# Patient Record
Sex: Female | Born: 1985 | Race: Black or African American | Hispanic: No | Marital: Married | Smoking: Former smoker
Health system: Southern US, Community
[De-identification: ages and names within clinical notes are randomized; demographics above are authoritative.]

## PROBLEM LIST (undated history)

## (undated) ENCOUNTER — Inpatient Hospital Stay (HOSPITAL_COMMUNITY): Payer: Self-pay

## (undated) ENCOUNTER — Inpatient Hospital Stay (HOSPITAL_COMMUNITY): Payer: BC Managed Care – PPO

## (undated) DIAGNOSIS — F329 Major depressive disorder, single episode, unspecified: Secondary | ICD-10-CM

## (undated) DIAGNOSIS — R0789 Other chest pain: Secondary | ICD-10-CM

## (undated) DIAGNOSIS — O139 Gestational [pregnancy-induced] hypertension without significant proteinuria, unspecified trimester: Secondary | ICD-10-CM

## (undated) DIAGNOSIS — R51 Headache: Secondary | ICD-10-CM

## (undated) DIAGNOSIS — F419 Anxiety disorder, unspecified: Secondary | ICD-10-CM

## (undated) DIAGNOSIS — R0602 Shortness of breath: Secondary | ICD-10-CM

## (undated) DIAGNOSIS — F32A Depression, unspecified: Secondary | ICD-10-CM

---

## 2008-09-21 ENCOUNTER — Emergency Department (HOSPITAL_COMMUNITY): Admission: EM | Admit: 2008-09-21 | Discharge: 2008-09-21 | Payer: Self-pay | Admitting: Emergency Medicine

## 2010-05-01 LAB — CBC
MCHC: 33.3 g/dL (ref 30.0–36.0)
MCV: 89.4 fL (ref 78.0–100.0)
Platelets: 218 10*3/uL (ref 150–400)
RDW: 13.6 % (ref 11.5–15.5)

## 2010-05-01 LAB — GC/CHLAMYDIA PROBE AMP, GENITAL
Chlamydia, DNA Probe: NEGATIVE
GC Probe Amp, Genital: NEGATIVE

## 2010-05-01 LAB — POCT I-STAT, CHEM 8
Calcium, Ion: 1.15 mmol/L (ref 1.12–1.32)
Chloride: 106 mEq/L (ref 96–112)
Glucose, Bld: 92 mg/dL (ref 70–99)
HCT: 39 % (ref 36.0–46.0)
Hemoglobin: 13.3 g/dL (ref 12.0–15.0)
TCO2: 23 mmol/L (ref 0–100)

## 2010-05-01 LAB — DIFFERENTIAL
Basophils Absolute: 0.3 10*3/uL — ABNORMAL HIGH (ref 0.0–0.1)
Basophils Relative: 4 % — ABNORMAL HIGH (ref 0–1)
Eosinophils Absolute: 0.2 10*3/uL (ref 0.0–0.7)
Neutrophils Relative %: 37 % — ABNORMAL LOW (ref 43–77)

## 2010-05-01 LAB — URINALYSIS, ROUTINE W REFLEX MICROSCOPIC
Bilirubin Urine: NEGATIVE
Glucose, UA: NEGATIVE mg/dL
Hgb urine dipstick: NEGATIVE
Ketones, ur: NEGATIVE mg/dL
Nitrite: NEGATIVE
Protein, ur: NEGATIVE mg/dL
Specific Gravity, Urine: 1.03 (ref 1.005–1.030)
Urobilinogen, UA: 1 mg/dL (ref 0.0–1.0)
pH: 5.5 (ref 5.0–8.0)

## 2010-05-01 LAB — WET PREP, GENITAL: Trich, Wet Prep: NONE SEEN

## 2010-05-01 LAB — SYPHILIS: RPR W/REFLEX TO RPR TITER AND TREPONEMAL ANTIBODIES, TRADITIONAL SCREENING AND DIAGNOSIS ALGORITHM: RPR Ser Ql: NONREACTIVE

## 2010-05-03 ENCOUNTER — Other Ambulatory Visit: Payer: Self-pay | Admitting: Family Medicine

## 2010-05-03 DIAGNOSIS — R102 Pelvic and perineal pain: Secondary | ICD-10-CM

## 2010-05-04 ENCOUNTER — Ambulatory Visit
Admission: RE | Admit: 2010-05-04 | Discharge: 2010-05-04 | Disposition: A | Discharge status: Home / Self Care | Payer: BLUE CROSS/BLUE SHIELD | Source: Ambulatory Visit | Attending: Family Medicine | Admitting: Family Medicine

## 2010-05-04 DIAGNOSIS — R102 Pelvic and perineal pain: Secondary | ICD-10-CM

## 2010-10-11 ENCOUNTER — Inpatient Hospital Stay (HOSPITAL_COMMUNITY)
Admission: AD | Admit: 2010-10-11 | Discharge: 2010-10-11 | Disposition: A | Discharge status: Home / Self Care | Payer: BC Managed Care – PPO | Source: Ambulatory Visit | Attending: Obstetrics and Gynecology | Admitting: Obstetrics and Gynecology

## 2010-10-11 ENCOUNTER — Encounter (HOSPITAL_COMMUNITY): Payer: Self-pay

## 2010-10-11 DIAGNOSIS — R109 Unspecified abdominal pain: Secondary | ICD-10-CM | POA: Insufficient documentation

## 2010-10-11 DIAGNOSIS — N803 Endometriosis of pelvic peritoneum, unspecified: Secondary | ICD-10-CM | POA: Insufficient documentation

## 2010-10-11 DIAGNOSIS — N809 Endometriosis, unspecified: Secondary | ICD-10-CM

## 2010-10-11 DIAGNOSIS — N949 Unspecified condition associated with female genital organs and menstrual cycle: Secondary | ICD-10-CM | POA: Insufficient documentation

## 2010-10-11 LAB — URINE MICROSCOPIC-ADD ON

## 2010-10-11 LAB — URINALYSIS, ROUTINE W REFLEX MICROSCOPIC
Bilirubin Urine: NEGATIVE
Ketones, ur: NEGATIVE mg/dL
Nitrite: NEGATIVE
pH: 6.5 (ref 5.0–8.0)

## 2010-10-11 MED ORDER — TRAMADOL HCL 50 MG PO TABS: 50.0000 mg | ORAL_TABLET | Freq: Four times a day (QID) | ORAL | Status: AC | PRN

## 2010-10-11 NOTE — ED Provider Notes (Signed)
Pt is a private pt of Dr. Kittie Plater.  She has known endometriosis and is scheduled for a laparoscopy 11/03/10.  Pt has had complete pelvic pain workup, but states she cannot wait until 10/10 as her pain is incapacitating.  The pain today is exactly the same type of pain that she has been experiencing.  Physical Exam:  HRR&R, LCTAB, abdomen soft, no guarding or rebound tenderness.  States pain is bilaterally in the ovary region.    Dr. Claiborne Billings called, who spoke with Dr. Henderson Cloud.  Dr. Henderson Cloud requests a prescription for Ultram 50mg  #30 with 1 refill be given, with f/u in her office next week.

## 2010-10-11 NOTE — Progress Notes (Signed)
Pt states she has a history of endometrosis. Was seen last week in the office and is scheduled for a laparoscopy on 10-10. Pt states she is unable to tolerate the pain.

## 2010-10-28 ENCOUNTER — Encounter (HOSPITAL_COMMUNITY)
Admission: RE | Admit: 2010-10-28 | Discharge: 2010-10-28 | Disposition: A | Discharge status: Home / Self Care | Payer: BC Managed Care – PPO | Source: Ambulatory Visit | Attending: Obstetrics and Gynecology | Admitting: Obstetrics and Gynecology

## 2010-10-28 ENCOUNTER — Encounter (HOSPITAL_COMMUNITY): Payer: Self-pay

## 2010-10-28 ENCOUNTER — Other Ambulatory Visit: Payer: Self-pay

## 2010-10-28 HISTORY — DX: Other chest pain: R07.89

## 2010-10-28 HISTORY — DX: Major depressive disorder, single episode, unspecified: F32.9

## 2010-10-28 HISTORY — DX: Headache: R51

## 2010-10-28 HISTORY — DX: Shortness of breath: R06.02

## 2010-10-28 HISTORY — DX: Depression, unspecified: F32.A

## 2010-10-28 HISTORY — DX: Anxiety disorder, unspecified: F41.9

## 2010-10-28 LAB — CBC
HCT: 43.3 % (ref 36.0–46.0)
Platelets: 267 10*3/uL (ref 150–400)
RDW: 14.2 % (ref 11.5–15.5)
WBC: 7 10*3/uL (ref 4.0–10.5)

## 2010-10-28 LAB — SURGICAL PCR SCREEN: Staphylococcus aureus: NEGATIVE

## 2010-10-28 NOTE — Anesthesia Preprocedure Evaluation (Signed)
Anesthesia Evaluation  Name, MR# and DOB Patient awake  General Assessment Comment  Reviewed: Allergy & Precautions, H&P , Patient's Chart, lab work & pertinent test results  Airway Mallampati: II TM Distance: >3 FB Neck ROM: full    Dental No notable dental hx. (+) Teeth Intact   Pulmonary  Has been having chest pain and SOB for last 3 weeks worse since started Ultram. clear to auscultation  Pulmonary exam normal       Cardiovascular regular Normal 12 lead EKG reviewed SB    Neuro/Psych Negative Neurological ROS  Negative Psych ROS   GI/Hepatic negative GI ROS Neg liver ROS    Endo/Other  Negative Endocrine ROS  Renal/GU negative Renal ROS  Genitourinary negative   Musculoskeletal   Abdominal   Peds  Hematology negative hematology ROS (+)   Anesthesia Other Findings   Reproductive/Obstetrics                           Anesthesia Physical Anesthesia Plan  ASA: I  Anesthesia Plan: General   Post-op Pain Management:    Induction:   Airway Management Planned:   Additional Equipment:   Intra-op Plan:   Post-operative Plan:   Informed Consent: I have reviewed the patients History and Physical, chart, labs and discussed the procedure including the risks, benefits and alternatives for the proposed anesthesia with the patient or authorized representative who has indicated his/her understanding and acceptance.   Dental Advisory Given  Plan Discussed with: Anesthesiologist  Anesthesia Plan Comments:         Anesthesia Quick Evaluation

## 2010-10-28 NOTE — Patient Instructions (Signed)
   Your procedure is scheduled on:11/03/10  Enter through the Main Entrance of [facility] at:10am Pick up the phone at the desk and dial [phone] and inform us of your arrival  Please call this number if you have any problems the morning of surgery: [phone]  Remember: Do not eat food after midnight  Do not drink clear liquids after:midnight Take these medicines the morning of surgery with a SIP OF WATER:pain meds if needed  Do not wear jewelry, make-up, or FINGER nail polish Do not wear lotions, powders, or perfumes.  You may wear deodorant. Do not shave 48 hours prior to surgery. Do not bring valuables to the hospital. Leave suitcase in the car. After Surgery it may be brought to your room. For patients being admitted to the hospital, checkout time is 11:00am the day of discharge.  Patients discharged on the day of surgery will not be allowed to drive home.   Name and phone number of your driver:Obrian- [phone]   Remember to use your hibiclens as instructed.Please shower with 1/2 bottle the evening before your surgery and the other 1/2 bottle the morning of surgery.

## 2010-10-29 ENCOUNTER — Other Ambulatory Visit (HOSPITAL_COMMUNITY): Payer: BLUE CROSS/BLUE SHIELD

## 2010-11-02 MED ORDER — MEPERIDINE HCL 25 MG/ML IJ SOLN: 6.2500 mg | INTRAMUSCULAR | Status: DC | PRN

## 2010-11-02 MED ORDER — SCOPOLAMINE 1 MG/3DAYS TD PT72
1.0000 | MEDICATED_PATCH | Freq: Once | TRANSDERMAL | Status: DC
  Administered 2010-11-03: 1.5 mg via TRANSDERMAL

## 2010-11-02 MED ORDER — LACTATED RINGERS IV SOLN
INTRAVENOUS | Status: DC
  Administered 2010-11-03 (×2): via INTRAVENOUS

## 2010-11-02 MED ORDER — METOCLOPRAMIDE HCL 5 MG/ML IJ SOLN: 10.0000 mg | Freq: Once | INTRAMUSCULAR | Status: AC | PRN

## 2010-11-02 MED ORDER — FENTANYL CITRATE 0.05 MG/ML IJ SOLN
25.0000 ug | INTRAMUSCULAR | Status: DC | PRN
  Administered 2010-11-03 (×3): 25 ug via INTRAVENOUS

## 2010-11-02 NOTE — H&P (Signed)
25 y.o. yo complains of severe dysmenorrhea, R and L LQpain, dyspareunia and menorrhagia.  These symptoms started two years ago and have gotten progressively worse.  Now pain is severe and causing distress daily, L>R and inhibiting patient's life.  Periods are even worse with pain and especially with the passage of clots.  The pain is sharp like knives and keeps her up at night.  Periods are very heavy and she now has bleeding in between periods. Pt was on OCPs in 2010 but had developed migraines.  She stopped pills in 2011, but migraines worsened.  She restarted OCPs recently but stopped again.  She also c/o deep dyspareunia with no relief with change in position.  She wishes to retain her fertility.  Past Medical History  Diagnosis Date  . Endometriosis   . Chest pain, atypical     per pt ? from Ultram- upper right chest pain  . Headache   . Anxiety   . Depression     no meds  . Shortness of breath    Past Surgical History  Procedure Date  . No past surgeries     History   Social History  . Marital Status: Single    Spouse Name: N/A    Number of Children: N/A  . Years of Education: N/A   Occupational History  . Not on file.   Social History Main Topics  . Smoking status: Current Some Day Smoker    Types: Cigarettes  . Smokeless tobacco: Not on file  . Alcohol Use: Yes     occasional  . Drug Use: No  . Sexually Active:    Other Topics Concern  . Not on file   Social History Narrative  . No narrative on file    No current facility-administered medications on file prior to encounter.   No current outpatient prescriptions on file prior to encounter.    No Known Allergies  @VITALS2 @  Lungs: clear to ascultation Cor:  RRR Abdomen:  soft, nontender, nondistended. Ex:  no cords, erythema Pelvic:  NEFG, Nml s/s uterus, tender L>R.  No masses.  U/S  6.7x3x3, no fibroids.  Nml R ovary 2.8; L ovary not seen.    A:  Probable endometriosis.  Pt denies PID in past.   Desires surgical resection if possible with preservation of fertility.   P:  For DX scope, possible Robo resection of endometriosis or adhesions.   All risks, benefits and alternatives d/w patient and she desires to proceed .  Patient has undergone a modified bowel prep and will receive preop antibiotics and SCDs during the operation.   I had prolonged discussion with patient about need for further pain management with alternative therapies and Lupron postoperatively.  Anishka Bushard,Atlee A

## 2010-11-03 ENCOUNTER — Encounter (HOSPITAL_COMMUNITY): Payer: Self-pay | Admitting: Anesthesiology

## 2010-11-03 ENCOUNTER — Encounter (HOSPITAL_COMMUNITY)
Admission: RE | Disposition: A | Discharge status: Home / Self Care | Payer: Self-pay | Source: Ambulatory Visit | Attending: Obstetrics and Gynecology

## 2010-11-03 ENCOUNTER — Ambulatory Visit (HOSPITAL_COMMUNITY)
Admission: RE | Admit: 2010-11-03 | Discharge: 2010-11-03 | Disposition: A | Discharge status: Home / Self Care | Payer: BC Managed Care – PPO | Source: Ambulatory Visit | Attending: Obstetrics and Gynecology | Admitting: Obstetrics and Gynecology

## 2010-11-03 ENCOUNTER — Ambulatory Visit (HOSPITAL_COMMUNITY): Payer: BC Managed Care – PPO | Admitting: Anesthesiology

## 2010-11-03 DIAGNOSIS — Z01818 Encounter for other preprocedural examination: Secondary | ICD-10-CM | POA: Insufficient documentation

## 2010-11-03 DIAGNOSIS — R1032 Left lower quadrant pain: Secondary | ICD-10-CM | POA: Insufficient documentation

## 2010-11-03 DIAGNOSIS — R1031 Right lower quadrant pain: Secondary | ICD-10-CM | POA: Insufficient documentation

## 2010-11-03 DIAGNOSIS — Z9889 Other specified postprocedural states: Secondary | ICD-10-CM

## 2010-11-03 DIAGNOSIS — IMO0002 Reserved for concepts with insufficient information to code with codable children: Secondary | ICD-10-CM | POA: Insufficient documentation

## 2010-11-03 DIAGNOSIS — Z01812 Encounter for preprocedural laboratory examination: Secondary | ICD-10-CM | POA: Insufficient documentation

## 2010-11-03 DIAGNOSIS — N920 Excessive and frequent menstruation with regular cycle: Secondary | ICD-10-CM | POA: Insufficient documentation

## 2010-11-03 DIAGNOSIS — N946 Dysmenorrhea, unspecified: Secondary | ICD-10-CM | POA: Insufficient documentation

## 2010-11-03 HISTORY — PX: LAPAROSCOPY: SHX197

## 2010-11-03 SURGERY — LAPAROSCOPY, DIAGNOSTIC
Anesthesia: General | Site: Abdomen | Wound class: Clean Contaminated

## 2010-11-03 MED ORDER — DEXAMETHASONE SODIUM PHOSPHATE 10 MG/ML IJ SOLN
INTRAMUSCULAR | Status: AC
  Filled 2010-11-03: qty 1

## 2010-11-03 MED ORDER — FENTANYL CITRATE 0.05 MG/ML IJ SOLN
INTRAMUSCULAR | Status: AC
  Administered 2010-11-03: 25 ug via INTRAVENOUS
  Filled 2010-11-03: qty 2

## 2010-11-03 MED ORDER — IBUPROFEN 200 MG PO TABS: 800.0000 mg | ORAL_TABLET | Freq: Four times a day (QID) | ORAL | Status: DC | PRN

## 2010-11-03 MED ORDER — ONDANSETRON HCL 4 MG/2ML IJ SOLN
INTRAMUSCULAR | Status: DC | PRN
  Administered 2010-11-03: 4 mg via INTRAVENOUS

## 2010-11-03 MED ORDER — OXYCODONE-ACETAMINOPHEN 5-325 MG PO TABS: 1.0000 | ORAL_TABLET | ORAL | Status: AC | PRN

## 2010-11-03 MED ORDER — PROPOFOL 10 MG/ML IV EMUL
INTRAVENOUS | Status: AC
  Filled 2010-11-03: qty 40

## 2010-11-03 MED ORDER — IBUPROFEN 200 MG PO TABS: 800.0000 mg | ORAL_TABLET | Freq: Three times a day (TID) | ORAL | Status: DC | PRN

## 2010-11-03 MED ORDER — ROCURONIUM BROMIDE 100 MG/10ML IV SOLN
INTRAVENOUS | Status: DC | PRN
  Administered 2010-11-03: 40 mg via INTRAVENOUS

## 2010-11-03 MED ORDER — SUFENTANIL CITRATE 50 MCG/ML IV SOLN
INTRAVENOUS | Status: DC | PRN
  Administered 2010-11-03: 20 ug via INTRAVENOUS

## 2010-11-03 MED ORDER — MIDAZOLAM HCL 2 MG/2ML IJ SOLN
INTRAMUSCULAR | Status: AC
  Filled 2010-11-03: qty 2

## 2010-11-03 MED ORDER — GLYCOPYRROLATE 0.2 MG/ML IJ SOLN
INTRAMUSCULAR | Status: DC | PRN
  Administered 2010-11-03: .3 mg via INTRAVENOUS

## 2010-11-03 MED ORDER — LIDOCAINE HCL (CARDIAC) 20 MG/ML IV SOLN
INTRAVENOUS | Status: DC | PRN
  Administered 2010-11-03: 60 mg via INTRAVENOUS

## 2010-11-03 MED ORDER — KETOROLAC TROMETHAMINE 30 MG/ML IJ SOLN
INTRAMUSCULAR | Status: AC
  Filled 2010-11-03: qty 1

## 2010-11-03 MED ORDER — ONDANSETRON HCL 4 MG/2ML IJ SOLN
INTRAMUSCULAR | Status: AC
  Filled 2010-11-03: qty 2

## 2010-11-03 MED ORDER — LIDOCAINE HCL (CARDIAC) 20 MG/ML IV SOLN
INTRAVENOUS | Status: AC
  Filled 2010-11-03: qty 5

## 2010-11-03 MED ORDER — SUFENTANIL CITRATE 50 MCG/ML IV SOLN
INTRAVENOUS | Status: AC
  Filled 2010-11-03: qty 1

## 2010-11-03 MED ORDER — ROCURONIUM BROMIDE 50 MG/5ML IV SOLN
INTRAVENOUS | Status: AC
  Filled 2010-11-03: qty 1

## 2010-11-03 MED ORDER — PROPOFOL 10 MG/ML IV EMUL
INTRAVENOUS | Status: DC | PRN
  Administered 2010-11-03: 180 mg via INTRAVENOUS

## 2010-11-03 MED ORDER — SCOPOLAMINE 1 MG/3DAYS TD PT72
MEDICATED_PATCH | TRANSDERMAL | Status: AC
  Administered 2010-11-03: 1.5 mg via TRANSDERMAL
  Filled 2010-11-03: qty 1

## 2010-11-03 MED ORDER — MIDAZOLAM HCL 5 MG/5ML IJ SOLN
INTRAMUSCULAR | Status: DC | PRN
  Administered 2010-11-03: 2 mg via INTRAVENOUS

## 2010-11-03 MED ORDER — NEOSTIGMINE METHYLSULFATE 1 MG/ML IJ SOLN
INTRAMUSCULAR | Status: AC
  Filled 2010-11-03: qty 10

## 2010-11-03 MED ORDER — NEOSTIGMINE METHYLSULFATE 1 MG/ML IJ SOLN
INTRAMUSCULAR | Status: DC | PRN
  Administered 2010-11-03: 5 mg via INTRAVENOUS

## 2010-11-03 MED ORDER — GLYCOPYRROLATE 0.2 MG/ML IJ SOLN
INTRAMUSCULAR | Status: AC
  Filled 2010-11-03: qty 2

## 2010-11-03 MED ORDER — DEXAMETHASONE SODIUM PHOSPHATE 10 MG/ML IJ SOLN
INTRAMUSCULAR | Status: DC | PRN
  Administered 2010-11-03: 10 mg via INTRAVENOUS

## 2010-11-03 MED ORDER — FENTANYL CITRATE 0.05 MG/ML IJ SOLN: 1.0000 ug/kg | INTRAMUSCULAR | Status: DC | PRN

## 2010-11-03 SURGICAL SUPPLY — 55 items
BARRIER ADHS 3X4 INTERCEED (GAUZE/BANDAGES/DRESSINGS) ×3 IMPLANT
BENZOIN TINCTURE PRP APPL 2/3 (GAUZE/BANDAGES/DRESSINGS) ×3 IMPLANT
BLADELESS LONG 8MM (BLADE) ×3 IMPLANT
CABLE HIGH FREQUENCY MONO STRZ (ELECTRODE) ×3 IMPLANT
CHLORAPREP W/TINT 26ML (MISCELLANEOUS) ×3 IMPLANT
CLOTH BEACON ORANGE TIMEOUT ST (SAFETY) ×3 IMPLANT
CONT PATH 16OZ SNAP LID 3702 (MISCELLANEOUS) ×3 IMPLANT
COVER MAYO STAND STRL (DRAPES) ×3 IMPLANT
COVER TABLE BACK 60X90 (DRAPES) ×6 IMPLANT
COVER TIP SHEARS 8 DVNC (MISCELLANEOUS) ×2 IMPLANT
COVER TIP SHEARS 8MM DA VINCI (MISCELLANEOUS) ×1
DECANTER SPIKE VIAL GLASS SM (MISCELLANEOUS) ×3 IMPLANT
DERMABOND ADVANCED (GAUZE/BANDAGES/DRESSINGS) ×1
DERMABOND ADVANCED .7 DNX12 (GAUZE/BANDAGES/DRESSINGS) ×2 IMPLANT
DRAPE HUG U DISPOSABLE (DRAPE) ×3 IMPLANT
DRAPE HYSTEROSCOPY (DRAPE) ×3 IMPLANT
DRAPE LG THREE QUARTER DISP (DRAPES) ×6 IMPLANT
DRAPE MONITOR DA VINCI (DRAPE) ×3 IMPLANT
DRAPE WARM FLUID 44X44 (DRAPE) ×3 IMPLANT
ELECT REM PT RETURN 9FT ADLT (ELECTROSURGICAL) ×3
ELECTRODE REM PT RTRN 9FT ADLT (ELECTROSURGICAL) ×2 IMPLANT
EVACUATOR SMOKE 8.L (FILTER) ×3 IMPLANT
GLOVE BIO SURGEON STRL SZ7 (GLOVE) ×9 IMPLANT
GLOVE ECLIPSE 6.5 STRL STRAW (GLOVE) ×9 IMPLANT
GOWN PREVENTION PLUS LG XLONG (DISPOSABLE) ×18 IMPLANT
KIT DISP ACCESSORY 4 ARM (KITS) ×3 IMPLANT
NEEDLE INSUFFLATION 14GA 120MM (NEEDLE) ×3 IMPLANT
NS IRRIG 1000ML POUR BTL (IV SOLUTION) ×9 IMPLANT
OCCLUDER COLPOPNEUMO (BALLOONS) ×6 IMPLANT
PACK LAVH (CUSTOM PROCEDURE TRAY) ×3 IMPLANT
PAD PREP 24X48 CUFFED NSTRL (MISCELLANEOUS) ×6 IMPLANT
POSITIONER SURGICAL ARM (MISCELLANEOUS) ×6 IMPLANT
SET IRRIG TUBING LAPAROSCOPIC (IRRIGATION / IRRIGATOR) ×3 IMPLANT
SOLUTION ELECTROLUBE (MISCELLANEOUS) ×3 IMPLANT
STRIP CLOSURE SKIN 1/2X4 (GAUZE/BANDAGES/DRESSINGS) ×3 IMPLANT
SUT VIC AB 0 CT1 27 (SUTURE) ×5
SUT VIC AB 0 CT1 27XBRD ANBCTR (SUTURE) ×10 IMPLANT
SUT VIC AB 2-0 CT2 27 (SUTURE) ×6 IMPLANT
SUT VICRYL 0 UR6 27IN ABS (SUTURE) ×6 IMPLANT
SUT VICRYL RAPIDE 3 0 (SUTURE) ×6 IMPLANT
SYR 50ML LL SCALE MARK (SYRINGE) ×3 IMPLANT
SYSTEM CONVERTIBLE TROCAR (TROCAR) IMPLANT
TIP UTERINE 5.1X6CM LAV DISP (MISCELLANEOUS) IMPLANT
TIP UTERINE 6.7X10CM GRN DISP (MISCELLANEOUS) IMPLANT
TIP UTERINE 6.7X6CM WHT DISP (MISCELLANEOUS) IMPLANT
TIP UTERINE 6.7X8CM BLUE DISP (MISCELLANEOUS) ×3 IMPLANT
TOWEL OR 17X24 6PK STRL BLUE (TOWEL DISPOSABLE) ×6 IMPLANT
TRAY FOLEY BAG SILVER LF 14FR (CATHETERS) ×3 IMPLANT
TROCAR DISP BLADELESS 8 DVNC (TROCAR) ×2 IMPLANT
TROCAR DISP BLADELESS 8MM (TROCAR) ×1
TROCAR XCEL 12X100 BLDLESS (ENDOMECHANICALS) ×3 IMPLANT
TROCAR Z-THREAD 12X150 (TROCAR) IMPLANT
TUBING FILTER THERMOFLATOR (ELECTROSURGICAL) ×3 IMPLANT
WARMER LAPAROSCOPE (MISCELLANEOUS) ×3 IMPLANT
WATER STERILE IRR 1000ML POUR (IV SOLUTION) ×3 IMPLANT

## 2010-11-03 NOTE — Progress Notes (Signed)
Pt up to the bathroom to void, initially unable fluids resumed and 600 cc LR given, up to void with good results.  Will discharge home

## 2010-11-03 NOTE — Transfer of Care (Signed)
Immediate Anesthesia Transfer of Care Note  Patient: Donna Alvarado  Procedure(s) Performed:  LAPAROSCOPY DIAGNOSTIC - Chromopertubation  Patient Location: PACU  Anesthesia Type: General  Level of Consciousness: alert , oriented and sedated  Airway & Oxygen Therapy: Patient Spontanous Breathing and Patient connected to nasal cannula oxygen  Post-op Assessment: Report given to PACU RN and Post -op Vital signs reviewed and stable  Post vital signs: stable  Complications: No apparent anesthesia complications

## 2010-11-03 NOTE — Op Note (Signed)
11/03/2010  12:20 PM  PATIENT:  Donna Alvarado  25 y.o. female  PRE-OPERATIVE DIAGNOSIS:  Pelvic Pain  POST-OPERATIVE DIAGNOSIS:  Pelvic Pain  PROCEDURE:  Procedure(s): LAPAROSCOPY DIAGNOSTIC  SURGEON:  Surgeon(s): Mahathi A Remiel Corti W Scott Bowie  PHYSICIAN ASSISTANT:   ASSISTANTS: Bowie   ANESTHESIA:   general  EBL:  Total I/O In: 1000 [I.V.:1000] Out: -   BLOOD ADMINISTERED:none  SPECIMEN:  No Specimen  DISPOSITION OF SPECIMEN:  N/A  COUNTS:  YES  DICTATION: See below  PLAN OF CARE: Discharge to home after PACU  PATIENT DISPOSITION:  PACU - hemodynamically stable.   Delay start of Pharmacological VTE agent (>24hrs) due to surgical blood loss or risk of bleeding:  not applicable  Complications: none  Medications:  None  Findings:  Apparently normal pelvis.  No lesions or adhesions seen on any structures, including the ovaries, peritoneum under ovaries, uterus, tubes, cul de sac, anterior bladder or anterior abdominal wall.  The liver edge, diaphragm and appendix were all normal.  The tubes were normal caliber and had normal fimbriae and there was excellent spillage from both tubes.    Technique: After adequate general anesthesia was achieved, the patient was prepped and draped in the usual fashion.  A 8 cm Rumi was placed in the cervix and secured.  The bladder was drained with a foley.  A two cm incision was made above the umbilicus and the veress needle passed inside without aspiration of bowel contents or blood.  The 12 mm trocar was placed without comp after the abdomin had been insufflated.  The camera was placed inside and a 8.5 mm trocar placed 3 above the right iliac crest under direct visualization of the camera.  A careful and systematic exploration was performed and the above findings were noted.  Only the small direct inguinal hernia on the right was seen.  No further operation was needed and the instruments were removed from the abdomen and the  abdomen desufflated.  The 12 mm trocar fascia was closed with a figure of eight stitch and the skin incisions closed with subq 3-0vicryl R.  The incisions were closed with dermabond.  The patient tolerated the procedure well and was returned to the recovery room in stable condition.    Melvin Whiteford,Mykelle A                

## 2010-11-03 NOTE — Brief Op Note (Signed)
11/03/2010  12:20 PM  PATIENT:  Donna Alvarado  25 y.o. female  PRE-OPERATIVE DIAGNOSIS:  Pelvic Pain  POST-OPERATIVE DIAGNOSIS:  Pelvic Pain  PROCEDURE:  Procedure(s): LAPAROSCOPY DIAGNOSTIC  SURGEON:  Surgeon(s): Elray Mcgregor  PHYSICIAN ASSISTANT:   ASSISTANTS: Bowie   ANESTHESIA:   general  EBL:  Total I/O In: 1000 [I.V.:1000] Out: -   BLOOD ADMINISTERED:none  SPECIMEN:  No Specimen  DISPOSITION OF SPECIMEN:  N/A  COUNTS:  YES  DICTATION: See below  PLAN OF CARE: Discharge to home after PACU  PATIENT DISPOSITION:  PACU - hemodynamically stable.   Delay start of Pharmacological VTE agent (>24hrs) due to surgical blood loss or risk of bleeding:  not applicable  Complications: none  Medications:  None  Findings:  Apparently normal pelvis.  No lesions or adhesions seen on any structures, including the ovaries, peritoneum under ovaries, uterus, tubes, cul de sac, anterior bladder or anterior abdominal wall.  The liver edge, diaphragm and appendix were all normal.  The tubes were normal caliber and had normal fimbriae and there was excellent spillage from both tubes.    Technique: After adequate general anesthesia was achieved, the patient was prepped and draped in the usual fashion.  A 8 cm Rumi was placed in the cervix and secured.  The bladder was drained with a foley.  A two cm incision was made above the umbilicus and the veress needle passed inside without aspiration of bowel contents or blood.  The 12 mm trocar was placed without comp after the abdomin had been insufflated.  The camera was placed inside and a 8.5 mm trocar placed 3 above the right iliac crest under direct visualization of the camera.  A careful and systematic exploration was performed and the above findings were noted.  Only the small direct inguinal hernia on the right was seen.  No further operation was needed and the instruments were removed from the abdomen and the  abdomen desufflated.  The 12 mm trocar fascia was closed with a figure of eight stitch and the skin incisions closed with subq 3-0vicryl R.  The incisions were closed with dermabond.  The patient tolerated the procedure well and was returned to the recovery room in stable condition.    Ladean Steinmeyer,Lindsea A

## 2010-11-03 NOTE — Anesthesia Postprocedure Evaluation (Signed)
Anesthesia Post Note  Patient: Donna Alvarado  Procedure(s) Performed:  LAPAROSCOPY DIAGNOSTIC - Chromopertubation  Anesthesia type: General  Patient location: PACU  Post pain: Pain level controlled  Post assessment: Post-op Vital signs reviewed  Last Vitals:  Filed Vitals:   11/03/10 1330  BP: 122/88  Pulse: 63  Temp:   Resp: 21    Post vital signs: Reviewed  Level of consciousness: sedated  Complications: No apparent anesthesia complications

## 2010-11-03 NOTE — Progress Notes (Signed)
There has been no change in the patients history, status or exam since the history and physical.  Filed Vitals:   11/03/10 1018  BP: 126/80  Pulse: 74  Temp: 98.1 F (36.7 C)  TempSrc: Oral  Resp: 18  SpO2: 100%    Lab Results  Component Value Date   WBC 7.0 10/28/2010   HGB 14.2 10/28/2010   HCT 43.3 10/28/2010   MCV 89.6 10/28/2010   PLT 267 10/28/2010    Marlene Pfluger,Armentha A

## 2010-11-04 NOTE — Anesthesia Postprocedure Evaluation (Signed)
  Anesthesia Post-op Note  Patient: Donna Alvarado  Procedure(s) Performed:  LAPAROSCOPY DIAGNOSTIC - Chromopertubation   Patient is awake, responsive, moving her legs, and has signs of resolution of her numbness. Pain and nausea are reasonably well controlled. Vital signs are stable and clinically acceptable. Oxygen saturation is clinically acceptable. There are no apparent anesthetic complications at this time. Patient is ready for discharge.

## 2010-11-07 ENCOUNTER — Encounter (HOSPITAL_COMMUNITY): Payer: Self-pay | Admitting: Obstetrics and Gynecology

## 2011-03-11 ENCOUNTER — Encounter: Payer: Self-pay | Admitting: Internal Medicine

## 2011-03-11 ENCOUNTER — Ambulatory Visit (INDEPENDENT_AMBULATORY_CARE_PROVIDER_SITE_OTHER): Payer: BLUE CROSS/BLUE SHIELD | Admitting: Internal Medicine

## 2011-03-11 VITALS — BP 111/79 | HR 87 | Temp 99.5°F | Resp 16 | Ht 62.5 in | Wt 162.2 lb

## 2011-03-11 DIAGNOSIS — R059 Cough, unspecified: Secondary | ICD-10-CM

## 2011-03-11 DIAGNOSIS — R05 Cough: Secondary | ICD-10-CM

## 2011-03-11 DIAGNOSIS — R1110 Vomiting, unspecified: Secondary | ICD-10-CM

## 2011-03-11 DIAGNOSIS — J029 Acute pharyngitis, unspecified: Secondary | ICD-10-CM

## 2011-03-11 DIAGNOSIS — B9689 Other specified bacterial agents as the cause of diseases classified elsewhere: Secondary | ICD-10-CM | POA: Insufficient documentation

## 2011-03-11 DIAGNOSIS — N760 Acute vaginitis: Secondary | ICD-10-CM

## 2011-03-11 LAB — POCT URINALYSIS DIPSTICK
Glucose, UA: NEGATIVE
Spec Grav, UA: 1.02
Urobilinogen, UA: NEGATIVE

## 2011-03-11 LAB — POCT CBC
Hemoglobin: 13.6 g/dL (ref 12.2–16.2)
Lymph, poc: 2.5 (ref 0.6–3.4)
MCH, POC: 27.6 pg (ref 27–31.2)
MCHC: 30.9 g/dL — AB (ref 31.8–35.4)
MID (cbc): 1 — AB (ref 0–0.9)
MPV: 12.2 fL (ref 0–99.8)
POC MID %: 13.5 %M — AB (ref 0–12)
Platelet Count, POC: 184 10*3/uL (ref 142–424)
WBC: 7.1 10*3/uL (ref 4.6–10.2)

## 2011-03-11 MED ORDER — MOMETASONE FUROATE 50 MCG/ACT NA SUSP: 2.0000 | Freq: Every day | NASAL | Status: DC

## 2011-03-11 MED ORDER — ONDANSETRON 4 MG PO TBDP
8.0000 mg | ORAL_TABLET | Freq: Once | ORAL | Status: AC
  Administered 2011-03-11: 8 mg via ORAL

## 2011-03-11 MED ORDER — AZITHROMYCIN 250 MG PO TABS: ORAL_TABLET | ORAL | Status: AC

## 2011-03-11 MED ORDER — ONDANSETRON HCL 4 MG PO TABS: 4.0000 mg | ORAL_TABLET | Freq: Three times a day (TID) | ORAL | Status: DC | PRN

## 2011-03-11 MED ORDER — ONDANSETRON HCL 4 MG PO TABS: 4.0000 mg | ORAL_TABLET | Freq: Three times a day (TID) | ORAL | Status: AC | PRN

## 2011-03-11 NOTE — Patient Instructions (Addendum)
Hold your Metronidazole for 3-4 days until you are feeling better than restart.  The vomiting may be due to the antibiotic especially when taken on an empty or almost empty stomach.  When you restart be sure you take it after a meal.  Take the Zofran every 8 hours as needed for nausea, stick with clear fluids today and advance as tolerated.  You may try Imodium for the diarrhea, take this as directed.  RTC if not better in 1-2 days  Viral Gastroenteritis Viral gastroenteritis is also known as stomach flu. This condition affects the stomach and intestinal tract. The illness typically lasts 3 to 8 days. Most people develop an immune response. This eventually gets rid of the virus. While this natural response develops, the virus can make you quite ill.  CAUSES  Diarrhea and vomiting are often caused by a virus. Medicines (antibiotics) that kill germs will not help unless there is also a germ (bacterial) infection. SYMPTOMS  The most common symptom is diarrhea. This can cause severe loss of fluids (dehydration) and body salt (electrolyte) imbalance. TREATMENT  Treatments for this illness are aimed at rehydration. Antidiarrheal medicines are not recommended. They do not decrease diarrhea volume and may be harmful. Usually, home treatment is all that is needed. The most serious cases involve vomiting so severely that you are not able to keep down fluids taken by mouth (orally). In these cases, intravenous (IV) fluids are needed. Vomiting with viral gastroenteritis is common, but it will usually go away with treatment. HOME CARE INSTRUCTIONS  Small amounts of fluids should be taken frequently. Large amounts at one time may not be tolerated. Plain water may be harmful in infants and the elderly. Oral rehydration solutions (ORS) are available at pharmacies and grocery stores. ORS replace water and important electrolytes in proper proportions. Sports drinks are not as effective as ORS and may be harmful due to  sugars worsening diarrhea.  As a general guideline for children, replace any new fluid losses from diarrhea or vomiting with ORS as follows:   If your child weighs 22 pounds or under (10 kg or less), give 60-120 mL (1/4 - 1/2 cup or 2 - 4 ounces) of ORS for each diarrheal stool or vomiting episode.   If your child weighs more than 22 pounds (more than 10 kgs), give 120-240 mL (1/2 - 1 cup or 4 - 8 ounces) of ORS for each diarrheal stool or vomiting episode.   In a child with vomiting, it may be helpful to give the above ORS replacement in 5 mL (1 teaspoon) amounts every 5 minutes, then increase as tolerated.   While correcting for dehydration, children should eat normally. However, foods high in sugar should be avoided because this may worsen diarrhea. Large amounts of carbonated soft drinks, juice, gelatin desserts, and other highly sugared drinks should be avoided.   After correction of dehydration, other liquids that are appealing to the child may be added. Children should drink small amounts of fluids frequently and fluids should be increased as tolerated.   Adults should eat normally while drinking more fluids than usual. Drink small amounts of fluids frequently and increase as tolerated. Drink enough water and fluids to keep your urine clear or pale yellow. Broths, weak decaffeinated tea, lemon-lime soft drinks (allowed to go flat), and ORS replace fluids and electrolytes.   Avoid:   Carbonated drinks.   Juice.   Extremely hot or cold fluids.   Caffeine drinks.   Fatty, greasy foods.  Alcohol.   Tobacco.   Too much intake of anything at one time.   Gelatin desserts.   Probiotics are active cultures of beneficial bacteria. They may lessen the amount and number of diarrheal stools in adults. Probiotics can be found in yogurt with active cultures and in supplements.   Wash your hands well to avoid spreading bacteria and viruses.   Antidiarrheal medicines are not  recommended for infants and children.   Only take over-the-counter or prescription medicines for pain, discomfort, or fever as directed by your caregiver. Do not give aspirin to children.   For adults with dehydration, ask your caregiver if you should continue all prescribed and over-the-counter medicines.   If your caregiver has given you a follow-up appointment, it is very important to keep that appointment. Not keeping the appointment could result in a lasting (chronic) or permanent injury and disability. If there is any problem keeping the appointment, you must call to reschedule.  SEEK IMMEDIATE MEDICAL CARE IF:   You are unable to keep fluids down.   There is no urine output in 6 to 8 hours or there is only a small amount of very dark urine.   You develop shortness of breath.   There is blood in the vomit (may look like coffee grounds) or stool.   Belly (abdominal) pain develops, increases, or localizes.   There is persistent vomiting or diarrhea.   You have a fever.   Your baby is older than 3 months with a rectal temperature of 102 F (38.9 C) or higher.   Your baby is 60 months old or younger with a rectal temperature of 100.4 F (38 C) or higher.  MAKE SURE YOU:   Understand these instructions.   Will watch your condition.   Will get help right away if you are not doing well or get worse.  Document Released: 01/10/2005 Document Revised: 09/22/2010 Document Reviewed: 05/24/2006 [facility] Patient Information 2012 [place], [place].

## 2011-03-11 NOTE — Progress Notes (Signed)
Subjective:    Patient ID: Donna Alvarado, female    DOB: June 24, 1985, 26 y.o.   MRN: [id number]  Cough This is a new problem. The current episode started in the past 7 days. The problem has been gradually worsening. The problem occurs every few minutes. The cough is productive of sputum. Associated symptoms include a fever, nasal congestion, postnasal drip and a sore throat. Pertinent negatives include no chills, headaches or shortness of breath. The treatment provided no relief. There is no history of asthma, bronchitis or pneumonia.  Emesis  This is a new problem. The current episode started yesterday. The problem occurs 2 to 4 times per day. The emesis has an appearance of stomach contents. Associated symptoms include abdominal pain, coughing, diarrhea and a fever. Pertinent negatives include no chills or headaches. Risk factors include ill contacts. She has tried nothing for the symptoms. The treatment provided no relief.  Patient was seen by MD in [place] and placed on Metronidazole which she started two days ago for BV.    Review of Systems  Constitutional: Positive for fever. Negative for chills.  HENT: Positive for sore throat and postnasal drip.   Respiratory: Positive for cough. Negative for shortness of breath.   Gastrointestinal: Positive for vomiting, abdominal pain and diarrhea. Negative for blood in stool.  Musculoskeletal: Negative.   Skin: Negative.   Neurological: Negative.  Negative for headaches.  Psychiatric/Behavioral: Negative.        Objective:   Physical Exam  Constitutional: She appears well-developed and well-nourished.       Pt appears well  HENT:  Head: Normocephalic.  Left Ear: External ear normal.  Mouth/Throat: No oropharyngeal exudate.       Oropharynx red.  PE tube in cerumen present in right IAC, Tm's appear clear bilaterally  Neck: Neck supple.       Mildly tender upper cervical nodes  Pulmonary/Chest: Effort normal and breath sounds  normal. No respiratory distress. She has no wheezes.  Lymphadenopathy:    She has cervical adenopathy.  Skin: Skin is warm and dry.  Psychiatric: She has a normal mood and affect. Her behavior is normal.          Assessment & Plan:  Vomiting:  Suspect this is partially caused by how she is taking her Metronidazole.  She is asked to hold and restart this in a few days and take with food.  Use Zofran and Imodium as needed, RTC if not improved in 1-2 days.  Mostly liquid diet.  No orthostasis or signs of infection found today on exam.  As she is a smoker we did give her a prescription for a Zpack which she can fill on Sunday if cough and congestion are not improving.  Use Nasonex for ETD and sinus pressure.  Pt agrees. Results for orders placed in visit on 03/11/11  POCT RAPID STREP A (OFFICE)      Component Value Range   Rapid Strep A Screen Negative  Negative   POCT CBC      Component Value Range   WBC 7.1  4.6 - 10.2 (K/uL)   Lymph, poc 2.5  0.6 - 3.4    POC LYMPH PERCENT 35.2  10 - 50 (%L)   MID (cbc) 1.0 (*) 0 - 0.9    POC MID % 13.5 (*) 0 - 12 (%M)   POC Granulocyte 3.6  2 - 6.9    Granulocyte percent 51.3  37 - 80 (%G)   RBC 4.92  4.04 -  5.48 (M/uL)   Hemoglobin 13.6  12.2 - 16.2 (g/dL)   HCT, POC 16.1  09.6 - 47.9 (%)   MCV 89.5  80 - 97 (fL)   MCH, POC 27.6  27 - 31.2 (pg)   MCHC 30.9 (*) 31.8 - 35.4 (g/dL)   RDW, POC 04.5     Platelet Count, POC 184  142 - 424 (K/uL)   MPV 12.2  0 - 99.8 (fL)  POCT URINE PREGNANCY      Component Value Range   Preg Test, Ur Negative    POCT URINALYSIS DIPSTICK      Component Value Range   Color, UA yellow     Clarity, UA cloudy     Glucose, UA neg     Bilirubin, UA neg     Ketones, UA neg     Spec Grav, UA 1.020     Blood, UA large     pH, UA 7.0     Protein, UA neg     Urobilinogen, UA negative     Nitrite, UA neg     Leukocytes, UA

## 2011-12-28 ENCOUNTER — Ambulatory Visit (INDEPENDENT_AMBULATORY_CARE_PROVIDER_SITE_OTHER): Payer: BC Managed Care – PPO | Admitting: Obstetrics and Gynecology

## 2011-12-28 ENCOUNTER — Encounter: Payer: Self-pay | Admitting: Obstetrics and Gynecology

## 2011-12-28 VITALS — BP 110/80 | Temp 98.7°F | Ht 62.0 in | Wt 167.0 lb

## 2011-12-28 DIAGNOSIS — R102 Pelvic and perineal pain: Secondary | ICD-10-CM

## 2011-12-28 DIAGNOSIS — G8929 Other chronic pain: Secondary | ICD-10-CM

## 2011-12-28 DIAGNOSIS — N949 Unspecified condition associated with female genital organs and menstrual cycle: Secondary | ICD-10-CM

## 2011-12-28 LAB — POCT URINALYSIS DIPSTICK
Glucose, UA: NEGATIVE
Ketones, UA: NEGATIVE
Protein, UA: NEGATIVE
Spec Grav, UA: 1.01

## 2011-12-28 MED ORDER — IBUPROFEN 600 MG PO TABS: 600.0000 mg | ORAL_TABLET | Freq: Four times a day (QID) | ORAL | Status: DC | PRN

## 2011-12-28 MED ORDER — MEDROXYPROGESTERONE ACETATE 10 MG PO TABS: 10.0000 mg | ORAL_TABLET | Freq: Every day | ORAL | Status: DC

## 2011-12-28 NOTE — Patient Instructions (Signed)
Pelvic Pain Pelvic pain is pain below the belly button and located between your hips. Acute pain may last a few hours or days. Chronic pelvic pain may last weeks and months. The cause may be different for different types of pain. The pain may be dull or sharp, mild or severe and can interfere with your daily activities. Write down and tell your caregiver:   Exactly where the pain is located.  If it comes and goes or is there all the time.  When it happens (with sex, urination, bowel movement, etc.)  If the pain is related to your menstrual period or stress. Your caregiver will take a full history and do a complete physical exam and Pap test. CAUSES   Painful menstrual periods (dysmenorrhea).  Normal ovulation (Mittelschmertz) that occurs in the middle of the menstrual cycle every month.  The pelvic organs get engorged with blood just before the menstrual period (pelvic congestive syndrome).  Scar tissue from an infection or past surgery (pelvic adhesions).  Cancer of the female pelvic organs. When there is pain with cancer, it has been there for a long time.  The lining of the uterus (endometrium) abnormally grows in places like the pelvis and on the pelvic organs (endometriosis).  A form of endometriosis with the lining of the uterus present inside of the muscle tissue of the uterus (adenomyosis).  Fibroid tumor (noncancerous) in the uterus.  Bladder problems such as infection, bladder spasms of the muscle tissue of the bladder.  Intestinal problems (irritable bowel syndrome, colitis, an ulcer or gastrointestinal infection).  Polyps of the cervix or uterus.  Pregnancy in the tube (ectopic pregnancy).  The opening of the cervix is too small for the menstrual blood to flow through it (cervical stenosis).  Physical or sexual abuse (past or present).  Musculo-skeletal problems from poor posture, problems with the vertebrae of the lower back or the uterine pelvic muscles falling  (prolapse).  Psychological problems such as depression or stress.  IUD (intrauterine device) in the uterus. DIAGNOSIS  Tests to make a diagnosis depends on the type, location, severity and what causes the pain to occur. Tests that may be needed include:  Blood tests.  Urine tests  Ultrasound.  X-rays.  CT Scan.  MRI.  Laparoscopy.  Major surgery. TREATMENT  Treatment will depend on the cause of the pain, which includes:  Prescription or over-the-counter pain medication.  Antibiotics.  Birth control pills.  Hormone treatment.  Nerve blocking injections.  Physical therapy.  Antidepressants.  Counseling with a psychiatrist or psychologist.  Minor or major surgery. HOME CARE INSTRUCTIONS   Only take over-the-counter or prescription medicines for pain, discomfort or fever as directed by your caregiver.  Follow your caregiver's advice to treat your pain.  Rest.  Avoid sexual intercourse if it causes the pain.  Apply warm or cold compresses (which ever works best) to the pain area.  Do relaxation exercises such as yoga or meditation.  Try acupuncture.  Avoid stressful situations.  Try group therapy.  If the pain is because of a stomach/intestinal upset, drink clear liquids, eat a bland light food diet until the symptoms go away. SEEK MEDICAL CARE IF:   You need stronger prescription pain medication.  You develop pain with sexual intercourse.  You have pain with urination.  You develop a temperature of 102 F (38.9 C) with the pain.  You are still in pain after 4 hours of taking prescription medication for the pain.  You need depression medication.    Your IUD is causing pain and you want it removed. SEEK IMMEDIATE MEDICAL CARE IF:  You develop very severe pain or tenderness.  You faint, have chills, severe weakness or dehydration.  You develop heavy vaginal bleeding or passing solid tissue.  You develop a temperature of 102 F (38.9 C)  with the pain.  You have blood in the urine.  You are being physically or sexually abused.  You have uncontrolled vomiting and diarrhea.  You are depressed and afraid of harming yourself or someone else. Document Released: 02/18/2004 Document Revised: 04/04/2011 Document Reviewed: 11/15/2007 [facility] Patient Information 2013 [place], [place]. If no period by January take provera for 5 days Call to schedule day 21 labs

## 2011-12-28 NOTE — Progress Notes (Signed)
Date pain started: Pt states she has been having pain daily for 3-4 years  Pain scale: 8/10 Imaging: previous u/s  Nausea,Vomiting,Fever,chills: nausea UTI SX: none H/O kidney stones: no What makes pain better/worse: Advil and Tylenol alleviates pain some Sexually active: yes  Pt has been evaluated for pain by another physician.  She has had L/S  That she reports was normal.  She tried Lupron which did not relieve her pain.  Her and her partner have had unprotected IC for over year and she has not conceived.  She did conceive in the past with him and he has a child.  Neither has any medical problems.  He does have a son.  She states that nothing makes it better or worse.  Occasionally after eating, she does have some pelvic pain.  It is intermittent.  She has a BM QD.  No blood in her stool BP 110/80  Temp 98.7 F (37.1 C) (Oral)  Ht 5\' 2"  (1.575 m)  Wt 167 lb (75.751 kg)  BMI 30.54 kg/m2  LMP 03/28/2011 Physical Examination: General appearance - alert, well appearing, and in no distress Chest - clear to auscultation, no wheezes, rales or rhonchi, symmetric air entry Heart - normal rate and regular rhythm Abdomen - soft, nontender, nondistended, no masses or organomegaly Pelvic - normal external genitalia, vulva, vagina, cervix, uterus and adnexa Extremities - peripheral pulses normal, no pedal edema, no clubbing or cyanosis Pelvic Pain Secondary infertility Need records from previous practice Pt without  Cycle bc of Lupron If UPT neg at the end of December tke provera for 5 days Call with menses to schedule day 21 labs and HSG Will check a SA, FBS, TSH. PRL, Insulin level and progesterone level

## 2011-12-29 LAB — GC/CHLAMYDIA PROBE AMP
CT Probe RNA: NEGATIVE
GC Probe RNA: NEGATIVE

## 2012-02-08 ENCOUNTER — Telehealth: Payer: Self-pay | Admitting: Obstetrics and Gynecology

## 2012-02-08 NOTE — Telephone Encounter (Signed)
Spoke with pt Donna Alvarado msg pt c/o irreg bleeding and pelvic pain pt has appt 02/10/12 at 3:15 with nd pt voice understanding

## 2012-02-10 ENCOUNTER — Encounter: Payer: Self-pay | Admitting: Obstetrics and Gynecology

## 2012-02-10 ENCOUNTER — Ambulatory Visit: Payer: BC Managed Care – PPO | Admitting: Obstetrics and Gynecology

## 2012-02-10 VITALS — BP 130/70 | Wt 165.0 lb

## 2012-02-10 DIAGNOSIS — N926 Irregular menstruation, unspecified: Secondary | ICD-10-CM

## 2012-02-10 LAB — POCT URINE PREGNANCY: Preg Test, Ur: NEGATIVE

## 2012-02-10 NOTE — Progress Notes (Signed)
When did bleeding start: 12/30/11 How  Long: 2 weeks  How often changing pad/tampon: approx 6 times per day Bleeding Disorders: no Cramping: yes Contraception: no Fibroids: no Hormone Therapy: no New Medications: no Menopausal Symptoms: no Vag. Discharge: no Abdominal Pain: yes, pelvic pain  Increased Stress: no BP 130/70  Wt 165 lb (74.844 kg)  LMP 12/30/2011 Physical Examination: General appearance - alert, well appearing, and in no distress Abdomen - soft, nontender, nondistended, no masses or organomegaly Pelvic - normal external genitalia, vulva, vagina, cervix, uterus and adnexa Pelvic pain Pt seen by GI.  No sign of pain being a GI source [place] @ [place]  Pt desires to persue infertility work up

## 2012-02-22 ENCOUNTER — Telehealth: Payer: Self-pay | Admitting: Obstetrics and Gynecology

## 2012-02-22 ENCOUNTER — Other Ambulatory Visit: Payer: Self-pay | Admitting: Obstetrics and Gynecology

## 2012-02-22 DIAGNOSIS — R102 Pelvic and perineal pain: Secondary | ICD-10-CM

## 2012-02-22 MED ORDER — HYDROCODONE-ACETAMINOPHEN 5-325 MG PO TABS: 1.0000 | ORAL_TABLET | Freq: Four times a day (QID) | ORAL | Status: DC | PRN

## 2012-02-22 NOTE — Telephone Encounter (Signed)
TC to pt. Informed RX for Vicodin called to pharmacy.  Referred to Dr Alwyn Pea as PCP.  U/S to be scheduled by Misty Stanley at checkout and contact with appt.Pt verbalizes comprehension.

## 2012-02-22 NOTE — Telephone Encounter (Signed)
Tc to pt.  States was awaiting Rx for pain med after discussion at last visit, but none at pharmacy.  No relief with Ibuprofen.  Also awaiting recommendation for PCP.  Does not receive appt for U/S.  Will consult wit Dr ND.

## 2012-02-28 ENCOUNTER — Encounter: Payer: BC Managed Care – PPO | Admitting: Obstetrics and Gynecology

## 2012-05-01 ENCOUNTER — Ambulatory Visit (INDEPENDENT_AMBULATORY_CARE_PROVIDER_SITE_OTHER): Payer: Self-pay | Admitting: General Surgery

## 2012-05-07 ENCOUNTER — Encounter (INDEPENDENT_AMBULATORY_CARE_PROVIDER_SITE_OTHER): Payer: Self-pay

## 2012-05-07 ENCOUNTER — Encounter (INDEPENDENT_AMBULATORY_CARE_PROVIDER_SITE_OTHER): Payer: Self-pay | Admitting: General Surgery

## 2012-05-07 ENCOUNTER — Ambulatory Visit (INDEPENDENT_AMBULATORY_CARE_PROVIDER_SITE_OTHER): Payer: BC Managed Care – PPO | Admitting: General Surgery

## 2012-05-07 VITALS — BP 136/86 | HR 71 | Temp 97.6°F | Resp 16 | Ht 61.0 in | Wt 162.8 lb

## 2012-05-07 DIAGNOSIS — R1032 Left lower quadrant pain: Secondary | ICD-10-CM

## 2012-05-07 NOTE — Progress Notes (Signed)
Patient ID: Donna Alvarado, female   DOB: Feb 27, 1985, 27 y.o.   MRN: [id number]  No chief complaint on file.   HPI Donna Alvarado is a 27 y.o. female.  This patient was referred by Dr. Hal Neer for evaluation for possible left inguinal hernia. She says that she has had chronic pelvic pain and has been evaluated by Dr. Henderson Cloud with diagnostic laparoscopy back in October of 2012. At the time of surgery was no obvious cause of her discomfort identified but she did note a small direct right inguinal hernia at the time of laparoscopy. The patient continues to have pelvic pain across her lower abdomen mainly located in her left groin and left lower quadrant where she says it feels like "something is clogged".  She says that this is worse with prolonged sitting and relieved with standing up she does have associated dyspareunia and in her pain is more noticeable with her cycle. She says this is a constant 7/10 pain but with her cycle is a 10 out of 10 pain. She has been evaluated by a pelvic ultrasound which was negative and had a GI evaluation as well without any obvious cause identified. She states that her bowels are normal about once per day and denies any blood in the stools. HPI  Past Medical History  Diagnosis Date  . Endometriosis   . Chest pain, atypical     per pt ? from Ultram- upper right chest pain  . Headache   . Anxiety   . Depression     no meds  . Shortness of breath     Past Surgical History  Procedure Laterality Date  . No past surgeries    . Laparoscopy  11/03/2010    Procedure: LAPAROSCOPY DIAGNOSTIC;  Surgeon: Loney Laurence;  Location: [facility];  Service: Gynecology;;  Chromopertubation    No family history on file.  Social History History  Substance Use Topics  . Smoking status: Current Some Day Smoker    Types: Cigarettes  . Smokeless tobacco: Not on file  . Alcohol Use: Yes     Comment: occasional    No Known Allergies  Current Outpatient Prescriptions   Medication Sig Dispense Refill  . Docusate Calcium (STOOL SOFTENER PO) Take 1 tablet by mouth daily as needed. For constipation.       Marland Kitchen HYDROcodone-acetaminophen (NORCO/VICODIN) 5-325 MG per tablet Take 1 tablet by mouth every 6 (six) hours as needed for pain. 1-2 tablets q 4-6 hours prn pain  30 tablet  0  . ibuprofen (ADVIL,MOTRIN) 200 MG tablet Take 4 tablets (800 mg total) by mouth every 8 (eight) hours as needed for pain. For pain.  30 tablet  1  . ibuprofen (ADVIL,MOTRIN) 600 MG tablet Take 1 tablet (600 mg total) by mouth every 6 (six) hours as needed for pain.  30 tablet  0  . medroxyPROGESTERone (PROVERA) 10 MG tablet Take 1 tablet (10 mg total) by mouth daily.  30 tablet  1  . norgestrel-ethinyl estradiol (LO/OVRAL,CRYSELLE) 0.3-30 MG-MCG tablet Take 1 tablet by mouth daily.        . metroNIDAZOLE (FLAGYL) 500 MG tablet Take 500 mg by mouth 2 (two) times daily with a meal.      . mometasone (NASONEX) 50 MCG/ACT nasal spray Place 2 sprays into the nose daily.  17 g  1   No current facility-administered medications for this visit.    Review of Systems Review of Systems All other review of systems negative or noncontributory except  as stated in the HPI  Blood pressure 136/86, pulse 71, temperature 97.6 F (36.4 C), temperature source Temporal, resp. rate 16, height 5\' 1"  (1.549 m), weight 162 lb 12.8 oz (73.846 kg).  Physical Exam Physical Exam Physical Exam  Nursing note and vitals reviewed. Constitutional: She is oriented to person, place, and time. She appears well-developed and well-nourished. No distress.  HENT:  Head: Normocephalic and atraumatic.  Mouth/Throat: No oropharyngeal exudate.  Eyes: Conjunctivae and EOM are normal. Pupils are equal, round, and reactive to light. Right eye exhibits no discharge. Left eye exhibits no discharge. No scleral icterus.  Neck: Normal range of motion. Neck supple. No tracheal deviation present.  Cardiovascular: Normal rate, regular  rhythm, normal heart sounds and intact distal pulses.   Pulmonary/Chest: Effort normal and breath sounds normal. No stridor. No respiratory distress. She has no wheezes.  Abdominal: Soft. Bowel sounds are normal. She exhibits no distension and no mass. There is no tenderness. There is no rebound and no guarding. I did not identify any inguinal bulge with valsalva on either groin.  She was tender bilaterally, worse on the left Musculoskeletal: Normal range of motion. She exhibits no edema and no tenderness.  Neurological: She is alert and oriented to person, place, and time.  Skin: Skin is warm and dry. No rash noted. She is not diaphoretic. No erythema. No pallor.  Psychiatric: She has a normal mood and affect. Her behavior is normal. Judgment and thought content normal.    Data Reviewed Op note  Assessment    Left groin pain This is a difficult situation because I do not identify any obvious cause of her symptoms. I reviewed Dr. Kittie Plater operative note from her diagnostic laparoscopy and she noted a small direct right inguinal hernia. The patient's symptoms do not sound are classic for inguinal hernia and her symptoms are more intense on the left as well. I did not appreciate any hernia on exam. I really not certain what is causing her symptoms and have offered CT scan of the abdomen for further evaluation although if this is negative then I am not sure what else I can offer her. We will go ahead and set her up for CT scan of the abdomen and pelvis and she will follow up with me after this to discuss results. If this is negative then I would recommend watchful waiting for any possible hernia until a more obvious hernia developed. If this shows an obvious hernia, and then we may consider hernia to see if this might help her symptoms although I counseled her that he even with an obvious hernia, hernia repair may not make her symptoms better especially since her symptoms are very atypical    Plan     CT abdomen and follow up with me after this.        Lodema Pilot DAVID 05/07/2012, 10:46 AM

## 2012-05-10 ENCOUNTER — Ambulatory Visit
Admission: RE | Admit: 2012-05-10 | Discharge: 2012-05-10 | Disposition: A | Discharge status: Home / Self Care | Payer: BC Managed Care – PPO | Source: Ambulatory Visit | Attending: General Surgery | Admitting: General Surgery

## 2012-05-10 MED ORDER — IOHEXOL 300 MG/ML  SOLN
100.0000 mL | Freq: Once | INTRAMUSCULAR | Status: AC | PRN
  Administered 2012-05-10: 100 mL via INTRAVENOUS

## 2012-05-21 ENCOUNTER — Telehealth (INDEPENDENT_AMBULATORY_CARE_PROVIDER_SITE_OTHER): Payer: Self-pay | Admitting: General Surgery

## 2012-05-21 NOTE — Telephone Encounter (Signed)
Patient calling to get results of her CT scan. I advised the CT did not show any findings. She states she does want to get the right sided hernia fixed that was found during her laparoscopy and wants to set this up for some time in June. Please advise.

## 2012-05-22 ENCOUNTER — Telehealth (INDEPENDENT_AMBULATORY_CARE_PROVIDER_SITE_OTHER): Payer: Self-pay

## 2012-05-22 NOTE — Telephone Encounter (Signed)
Called patient to discuss CT results (no acute findings in the abdomen and pelvis) Dr. Biagio Quint states the CT revealed no hernia and at this point he recommends watchful waiting until a more obvious hernia develops.

## 2012-09-03 ENCOUNTER — Ambulatory Visit (INDEPENDENT_AMBULATORY_CARE_PROVIDER_SITE_OTHER): Payer: BLUE CROSS/BLUE SHIELD | Admitting: Family Medicine

## 2012-09-03 VITALS — BP 116/64 | HR 81 | Temp 98.0°F | Resp 18 | Ht 62.5 in | Wt 157.0 lb

## 2012-09-03 DIAGNOSIS — R82998 Other abnormal findings in urine: Secondary | ICD-10-CM

## 2012-09-03 DIAGNOSIS — N898 Other specified noninflammatory disorders of vagina: Secondary | ICD-10-CM

## 2012-09-03 DIAGNOSIS — R8290 Unspecified abnormal findings in urine: Secondary | ICD-10-CM

## 2012-09-03 DIAGNOSIS — Z331 Pregnant state, incidental: Secondary | ICD-10-CM

## 2012-09-03 DIAGNOSIS — N949 Unspecified condition associated with female genital organs and menstrual cycle: Secondary | ICD-10-CM

## 2012-09-03 DIAGNOSIS — M25552 Pain in left hip: Secondary | ICD-10-CM

## 2012-09-03 DIAGNOSIS — Z113 Encounter for screening for infections with a predominantly sexual mode of transmission: Secondary | ICD-10-CM

## 2012-09-03 DIAGNOSIS — N912 Amenorrhea, unspecified: Secondary | ICD-10-CM

## 2012-09-03 DIAGNOSIS — Z3490 Encounter for supervision of normal pregnancy, unspecified, unspecified trimester: Secondary | ICD-10-CM

## 2012-09-03 DIAGNOSIS — M25559 Pain in unspecified hip: Secondary | ICD-10-CM

## 2012-09-03 DIAGNOSIS — IMO0002 Reserved for concepts with insufficient information to code with codable children: Secondary | ICD-10-CM

## 2012-09-03 LAB — POCT WET PREP WITH KOH
Epithelial Wet Prep HPF POC: NEGATIVE
KOH Prep POC: NEGATIVE
RBC Wet Prep HPF POC: NEGATIVE
Trichomonas, UA: NEGATIVE
Yeast Wet Prep HPF POC: NEGATIVE

## 2012-09-03 LAB — POCT UA - MICROSCOPIC ONLY
Casts, Ur, LPF, POC: NEGATIVE
Crystals, Ur, HPF, POC: NEGATIVE
Mucus, UA: NEGATIVE
RBC, urine, microscopic: NEGATIVE
Yeast, UA: NEGATIVE

## 2012-09-03 LAB — POCT URINALYSIS DIPSTICK
Bilirubin, UA: NEGATIVE
Blood, UA: NEGATIVE
Glucose, UA: NEGATIVE
Ketones, UA: 15
Nitrite, UA: NEGATIVE
Protein, UA: NEGATIVE
Spec Grav, UA: >=1.03
Urobilinogen, UA: 0.2
pH, UA: 5.5

## 2012-09-03 LAB — OB RESULTS CONSOLE GC/CHLAMYDIA
CHLAMYDIA, DNA PROBE: NEGATIVE
GC PROBE AMP, GENITAL: NEGATIVE

## 2012-09-03 LAB — POCT URINE PREGNANCY: Preg Test, Ur: POSITIVE

## 2012-09-03 NOTE — Progress Notes (Signed)
[facility]:  Office Visit  Chief Complaint:  Chief Complaint  Patient presents with  . Pelvic Pain    on going, sharp pains   . Nausea    had positive upt 3 times   . tightness in stomach    HPI: Donna Alvarado is a 27 y.o. female who complains of here for multiple reasons. 1. Missed her cycle. Last LMP was 07/19/2012. She took a pregnancy test and it was + at home but wants to make sure 2. She also has had pelvic pain and also odor. She has pain only on left side when she is laying down, aching pain and throbbing pain. This has been ongoing for a few years now but she never noticed  new odor and also wants to make sure she does not have BV. She denies any STDs.   Past Medical History  Diagnosis Date  . Endometriosis   . Chest pain, atypical     per pt ? from Ultram- upper right chest pain  . Headache(784.0)   . Anxiety   . Depression     no meds  . Shortness of breath    Past Surgical History  Procedure Laterality Date  . No past surgeries    . Laparoscopy  11/03/2010    Procedure: LAPAROSCOPY DIAGNOSTIC;  Surgeon: Loney Laurence;  Location: [facility];  Service: Gynecology;;  Chromopertubation   History   Social History  . Marital Status: Single    Spouse Name: N/A    Number of Children: N/A  . Years of Education: N/A   Social History Main Topics  . Smoking status: Former Smoker    Types: Cigarettes  . Smokeless tobacco: None  . Alcohol Use: No     Comment: occasional  . Drug Use: No  . Sexually Active: Yes -- Female partner(s)    Birth Control/ Protection: None   Other Topics Concern  . None   Social History Narrative  . None   History reviewed. No pertinent family history. No Known Allergies Prior to Admission medications   Medication Sig Start Date End Date Taking? Authorizing Provider  Prenatal Vit-Fe Fumarate-FA (PRENATAL MULTIVITAMIN) TABS tablet Take 1 tablet by mouth daily at 12 noon.   Yes Historical Provider, MD      ROS: The patient denies fevers, chills, night sweats, unintentional weight loss, chest pain, palpitations, wheezing, dyspnea on exertion, nausea, vomiting, dysuria, hematuria, melena, numbness, weakness, or tingling.   All other systems have been reviewed and were otherwise negative with the exception of those mentioned in the HPI and as above.    PHYSICAL EXAM: Filed Vitals:   09/03/12 1848  BP: 116/64  Pulse: 81  Temp: 98 F (36.7 C)  Resp: 18   Filed Vitals:   09/03/12 1848  Height: 5' 2.5" (1.588 m)  Weight: 157 lb (71.215 kg)   Body mass index is 28.24 kg/(m^2).  General: Alert, no acute distress HEENT:  Normocephalic, atraumatic, oropharynx patent.  Cardiovascular:  Regular rate and rhythm, no rubs murmurs or gallops.  No Carotid bruits, radial pulse intact. No pedal edema.  Respiratory: Clear to auscultation bilaterally.  No wheezes, rales, or rhonchi.  No cyanosis, no use of accessory musculature GI: No organomegaly, abdomen is soft and non-tender, positive bowel sounds.  No masses. Skin: No rashes. Neurologic: Facial musculature symmetric. Psychiatric: Patient is appropriate throughout our interaction. Lymphatic: No cervical lymphadenopathy Musculoskeletal: Gait intact. Gu-white dc, no CMT, no masses, lesion, cervix closed  LABS: Results for orders placed in visit on 09/03/12  POCT URINE PREGNANCY      Result Value Range   Preg Test, Ur Positive    POCT WET PREP WITH KOH      Result Value Range   Trichomonas, UA Negative     Clue Cells Wet Prep HPF POC 0-4     Epithelial Wet Prep HPF POC neg\     Yeast Wet Prep HPF POC neg     Bacteria Wet Prep HPF POC 2+     RBC Wet Prep HPF POC neg     WBC Wet Prep HPF POC 0-5     KOH Prep POC Negative    POCT UA - MICROSCOPIC ONLY      Result Value Range   WBC, Ur, HPF, POC 6-8     RBC, urine, microscopic neg     Bacteria, U Microscopic 1+     Mucus, UA neg     Epithelial cells, urine per micros TNTC      Crystals, Ur, HPF, POC neg     Casts, Ur, LPF, POC neg     Yeast, UA neg    POCT URINALYSIS DIPSTICK      Result Value Range   Color, UA yellow     Clarity, UA clear     Glucose, UA neg     Bilirubin, UA neg     Ketones, UA 15     Spec Grav, UA >=1.030     Blood, UA neg     pH, UA 5.5     Protein, UA neg     Urobilinogen, UA 0.2     Nitrite, UA neg     Leukocytes, UA Trace       EKG/XRAY:   Primary read interpreted by Dr. Conley Rolls at [facility].   ASSESSMENT/PLAN: Encounter Diagnoses  Name Primary?  . Absent menses Yes  . Pain in joint, pelvic region and thigh, left   . Vaginal odor   . Screening for STD (sexually transmitted disease)     She is pregnant She probably just have physiological dc due to pregnancy  I will get a Urine cx before putting her on any meds for UTI She is advised to go see her Ob/gyn since she has been seeing her for chronic pelvic pain and they have been on fertility meds Take prenatal, drink lots of water F/u prn   Koltan Portocarrero PHUONG, DO 09/03/2012 8:03 PM

## 2012-09-04 LAB — RPR

## 2012-09-04 LAB — GC/CHLAMYDIA PROBE AMP
CT Probe RNA: NEGATIVE
GC Probe RNA: NEGATIVE

## 2012-09-04 LAB — HIV ANTIBODY (ROUTINE TESTING W REFLEX): HIV: NONREACTIVE

## 2012-09-04 LAB — HEPATITIS B SURFACE ANTIBODY, QUANTITATIVE: Hep B S AB Quant (Post): 3.4 m[IU]/mL

## 2012-09-04 LAB — HEPATITIS C ANTIBODY: HCV Ab: NEGATIVE

## 2012-09-04 LAB — HEPATITIS B SURFACE ANTIGEN: Hepatitis B Surface Ag: NEGATIVE

## 2012-09-05 ENCOUNTER — Telehealth: Payer: Self-pay | Admitting: Family Medicine

## 2012-09-05 LAB — URINE CULTURE

## 2012-09-05 LAB — HSV(HERPES SIMPLEX VRS) I + II AB-IGG
HSV 1 Glycoprotein G Ab, IgG: <0.1 IV
HSV 2 Glycoprotein G Ab, IgG: 0.29 IV

## 2012-09-05 NOTE — Telephone Encounter (Signed)
LM to call us back to get test results. STD tests except for HSV are back, all negative. Her urine cx came back and she has Corynebacterium in urine, it is a normal flora around the perineal/ vaginal area. Should not cause any problems unless she has worsening sxs. If hse has worsning sxs then need to get a better clean catch at ob/gyn or she can call back and let me know waht is going on,

## 2012-09-06 ENCOUNTER — Telehealth: Payer: Self-pay | Admitting: Radiology

## 2012-09-06 NOTE — Telephone Encounter (Signed)
Spoke to patient and she is improving. She has increased her water intake, and no longer has a strong smell to her urine. She is advised we will call when HSV results, all other negative/normal

## 2012-09-26 ENCOUNTER — Inpatient Hospital Stay (HOSPITAL_COMMUNITY)
Admission: AD | Admit: 2012-09-26 | Discharge: 2012-09-26 | Disposition: A | Discharge status: Home / Self Care | Payer: BC Managed Care – PPO | Source: Ambulatory Visit | Attending: Obstetrics and Gynecology | Admitting: Obstetrics and Gynecology

## 2012-09-26 ENCOUNTER — Encounter (HOSPITAL_COMMUNITY): Payer: Self-pay | Admitting: *Deleted

## 2012-09-26 ENCOUNTER — Inpatient Hospital Stay (HOSPITAL_COMMUNITY): Payer: BC Managed Care – PPO

## 2012-09-26 DIAGNOSIS — R109 Unspecified abdominal pain: Secondary | ICD-10-CM | POA: Insufficient documentation

## 2012-09-26 DIAGNOSIS — O209 Hemorrhage in early pregnancy, unspecified: Secondary | ICD-10-CM | POA: Insufficient documentation

## 2012-09-26 DIAGNOSIS — O99891 Other specified diseases and conditions complicating pregnancy: Secondary | ICD-10-CM | POA: Insufficient documentation

## 2012-09-26 LAB — CBC
HCT: 34.5 % — ABNORMAL LOW (ref 36.0–46.0)
Hemoglobin: 11.7 g/dL — ABNORMAL LOW (ref 12.0–15.0)
MCV: 85.6 fL (ref 78.0–100.0)
RDW: 13.1 % (ref 11.5–15.5)
WBC: 9.3 10*3/uL (ref 4.0–10.5)

## 2012-09-26 LAB — WET PREP, GENITAL: Trich, Wet Prep: NONE SEEN

## 2012-09-26 LAB — OB RESULTS CONSOLE ABO/RH: RH TYPE: POSITIVE

## 2012-09-26 LAB — HCG, QUANTITATIVE, PREGNANCY: hCG, Beta Chain, Quant, S: 60747 m[IU]/mL — ABNORMAL HIGH (ref <5)

## 2012-09-26 LAB — TYPE AND SCREEN: Antibody Screen: NEGATIVE

## 2012-09-26 LAB — OB RESULTS CONSOLE ANTIBODY SCREEN: Antibody Screen: NEGATIVE

## 2012-09-26 NOTE — MAU Note (Addendum)
Severe pain  In lower abd and pelvis for last wk, initially it was coming and going - now it is constant.  Started bleeding on Mon after appt, was heavy and now is slowing down. Did pass some clots.

## 2012-09-26 NOTE — MAU Provider Note (Signed)
27yo, G1P0000 at [redacted]w[redacted]d. Previously seen by Dr. Normand Sloop, still awaiting labs and U/S at shift change.  Pt discharged home.  Physical Exam   Blood pressure 128/63, pulse 74, temperature 97.9 F (36.6 C), temperature source Oral, resp. rate 18, height 5\' 1"  (1.549 m), weight 158 lb (71.668 kg), last menstrual period 07/19/2012.  Results for orders placed during the hospital encounter of 09/26/12 (from the past 24 hour(s))  WET PREP, GENITAL     Status: Abnormal   Collection Time    09/26/12  6:52 PM      Result Value Range   Yeast Wet Prep HPF POC NONE SEEN  NONE SEEN   Trich, Wet Prep NONE SEEN  NONE SEEN   Clue Cells Wet Prep HPF POC FEW (*) NONE SEEN   WBC, Wet Prep HPF POC FEW (*) NONE SEEN  HCG, QUANTITATIVE, PREGNANCY     Status: Abnormal   Collection Time    09/26/12  7:00 PM      Result Value Range   hCG, Beta Nyra Jabs, [place] [address] (*) <5 mIU/mL  CBC     Status: Abnormal   Collection Time    09/26/12  7:00 PM      Result Value Range   WBC 9.3  4.0 - 10.5 K/uL   RBC 4.03  3.87 - 5.11 MIL/uL   Hemoglobin 11.7 (*) 12.0 - 15.0 g/dL   HCT 60.4 (*) 54.0 - 98.1 %   MCV 85.6  78.0 - 100.0 fL   MCH 29.0  26.0 - 34.0 pg   MCHC 33.9  30.0 - 36.0 g/dL   RDW 19.1  47.8 - 29.5 %   Platelets 226  150 - 400 K/uL  TYPE AND SCREEN     Status: None   Collection Time    09/26/12  7:00 PM      Result Value Range   ABO/RH(D) O POS     Antibody Screen NEG     Sample Expiration 09/29/2012     U/S - [facility]UP at [redacted]w[redacted]d, with documented cardiac activity; minimal subchorionic hemorrhage or fluid; Incidental corpus luteum in the left ovary.  ED Course  IUP at [redacted]w[redacted]d  D/c home with precautions Keep scheduled ROB and U/S on 9/5    Haroldine Laws CNM, MSN 09/26/2012 9:46 PM

## 2012-09-26 NOTE — MAU Provider Note (Signed)
History of Present Illness   Patient Identification Donna Alvarado is a 27 y.o. female.  Patient information was obtained from patient. History/Exam limitations: none. Patient presented to the Emergency Department by private vehicle.  Chief Complaint  Abdominal Pain   The patient complains vaginal bleeding described as none now and but was heavy a couple of days ago and stopped yesterday, using 11 pad(s) since onset and abdominal pain located in the low back/pelvis described as cramping without radiation. Onset of symptoms was gradual starting 1 week ago. Severity of symptoms at onset was 6 /10. Symptoms occur pregnant (approximately [redacted] weeks pregnant) Symptoms have been intermittent. Symptoms are aggravated by emotional stress, alleviated by nothing and are associated with nothing. Other modifying factors include none. Previous evaluation includes none. Care prior to arrival consisted of rest and acetaminophen, with minimal relief.  Past Medical History  Diagnosis Date  . Endometriosis   . Chest pain, atypical     per pt ? from Ultram- upper right chest pain  . Headache(784.0)   . Anxiety   . Depression     no meds  . Shortness of breath    History reviewed. No pertinent family history. Scheduled Meds: Continuous Infusions: PRN Meds:    No Known Allergies History   Social History  . Marital Status: Single    Spouse Name: N/A    Number of Children: N/A  . Years of Education: N/A   Occupational History  . Not on file.   Social History Main Topics  . Smoking status: Former Smoker    Types: Cigarettes  . Smokeless tobacco: Not on file  . Alcohol Use: No     Comment: occasional  . Drug Use: No  . Sexual Activity: Yes    Partners: Male    Birth Control/ Protection: None   Other Topics Concern  . Not on file   Social History Narrative  . No narrative on file   Review of Systems A comprehensive review of systems was negative.   Physical Exam   BP 128/63   Pulse 74  Temp(Src) 97.9 F (36.6 C) (Oral)  Resp 18  Ht 5\' 1"  (1.549 m)  Wt 158 lb (71.668 kg)  BMI 29.87 kg/m2  LMP 07/19/2012 General:   alert and cooperative  Heart: regular rate and rhythm  Lungs: clear to auscultation bilaterally  Abdomen: soft, non-tender, without masses or organomegaly  Pelvic:    Vulva: normal   Vagina:  normal mucosa   Cervix: no cervical motion tenderness, no lesions and nulliparous appearance   Uterus: normal size, anteverted   Adnexa: normal adnexa   ED Course   Studies: pending  Records Reviewed: Old medical records.  Treatments: none  Consultations: none  Disposition: Pregnacy at 9 weeks per pt conceived with femara No active bleeding seen Check cbc, type and screen, and beta quant If beta quant greater than 1500 get [place] to check for viability

## 2012-10-29 ENCOUNTER — Ambulatory Visit: Payer: BC Managed Care – PPO | Attending: Obstetrics and Gynecology | Admitting: Physical Therapy

## 2012-10-29 DIAGNOSIS — M542 Cervicalgia: Secondary | ICD-10-CM | POA: Insufficient documentation

## 2012-10-29 DIAGNOSIS — M545 Low back pain, unspecified: Secondary | ICD-10-CM | POA: Insufficient documentation

## 2012-10-29 DIAGNOSIS — IMO0001 Reserved for inherently not codable concepts without codable children: Secondary | ICD-10-CM | POA: Insufficient documentation

## 2012-11-05 ENCOUNTER — Ambulatory Visit: Payer: BC Managed Care – PPO | Admitting: Physical Therapy

## 2012-11-07 ENCOUNTER — Ambulatory Visit: Payer: BC Managed Care – PPO | Admitting: Physical Therapy

## 2012-11-14 ENCOUNTER — Ambulatory Visit: Payer: BC Managed Care – PPO | Admitting: Rehabilitation

## 2012-11-16 ENCOUNTER — Ambulatory Visit: Payer: BC Managed Care – PPO | Admitting: Physical Therapy

## 2012-11-20 ENCOUNTER — Ambulatory Visit: Payer: BC Managed Care – PPO | Admitting: Physical Therapy

## 2012-11-26 ENCOUNTER — Ambulatory Visit: Payer: BC Managed Care – PPO | Admitting: Physical Therapy

## 2012-11-28 ENCOUNTER — Ambulatory Visit: Payer: BC Managed Care – PPO | Admitting: Physical Therapy

## 2012-12-03 ENCOUNTER — Ambulatory Visit: Payer: BC Managed Care – PPO | Admitting: Rehabilitation

## 2012-12-05 ENCOUNTER — Encounter: Payer: BC Managed Care – PPO | Admitting: Physical Therapy

## 2012-12-05 LAB — OB RESULTS CONSOLE RUBELLA ANTIBODY, IGM: Rubella: IMMUNE

## 2012-12-05 LAB — OB RESULTS CONSOLE RPR: RPR: NONREACTIVE

## 2012-12-05 LAB — OB RESULTS CONSOLE HIV ANTIBODY (ROUTINE TESTING): HIV: NONREACTIVE

## 2012-12-05 LAB — OB RESULTS CONSOLE HEPATITIS B SURFACE ANTIGEN: HEP B S AG: NEGATIVE

## 2012-12-14 ENCOUNTER — Encounter (HOSPITAL_COMMUNITY): Payer: Self-pay | Admitting: *Deleted

## 2012-12-14 ENCOUNTER — Inpatient Hospital Stay (HOSPITAL_COMMUNITY)
Admission: AD | Admit: 2012-12-14 | Discharge: 2012-12-14 | Disposition: A | Discharge status: Home / Self Care | Payer: BC Managed Care – PPO | Source: Ambulatory Visit | Attending: Obstetrics and Gynecology | Admitting: Obstetrics and Gynecology

## 2012-12-14 DIAGNOSIS — Z8742 Personal history of other diseases of the female genital tract: Secondary | ICD-10-CM

## 2012-12-14 DIAGNOSIS — F329 Major depressive disorder, single episode, unspecified: Secondary | ICD-10-CM | POA: Diagnosis not present

## 2012-12-14 DIAGNOSIS — N390 Urinary tract infection, site not specified: Secondary | ICD-10-CM | POA: Diagnosis present

## 2012-12-14 DIAGNOSIS — O2390 Unspecified genitourinary tract infection in pregnancy, unspecified trimester: Secondary | ICD-10-CM | POA: Insufficient documentation

## 2012-12-14 DIAGNOSIS — F411 Generalized anxiety disorder: Secondary | ICD-10-CM | POA: Diagnosis not present

## 2012-12-14 DIAGNOSIS — F32A Depression, unspecified: Secondary | ICD-10-CM | POA: Diagnosis not present

## 2012-12-14 DIAGNOSIS — R109 Unspecified abdominal pain: Secondary | ICD-10-CM | POA: Insufficient documentation

## 2012-12-14 LAB — URINALYSIS, ROUTINE W REFLEX MICROSCOPIC
Bilirubin Urine: NEGATIVE
Glucose, UA: 100 mg/dL — AB
Ketones, ur: 40 mg/dL — AB
Protein, ur: 100 mg/dL — AB
Urobilinogen, UA: 1 mg/dL (ref 0.0–1.0)

## 2012-12-14 LAB — URINE MICROSCOPIC-ADD ON

## 2012-12-14 MED ORDER — CEPHALEXIN 500 MG PO CAPS: 500.0000 mg | ORAL_CAPSULE | Freq: Two times a day (BID) | ORAL | Status: AC

## 2012-12-14 NOTE — MAU Provider Note (Signed)
History   27 yo G1P0 at 28 1/7 weeks presented unannounced c/o UTI sx for several days.  Has been using an OTC remedy for bladder spasms this week, but sx have continued.  Denies fever, N/V, uterine cramping, or other issues.  Had negative urine culture 11/25/12.  Patient Active Problem List   Diagnosis Date Noted  . 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 Depression 12/14/2012  . Generalized anxiety disorder 12/14/2012  . UTI (urinary tract infection) 12/14/2012  . BV (bacterial vaginosis) 03/11/2011     Chief Complaint  Patient presents with  . Abdominal Pain  . Dysuria   HPI:  See above  OB History   Grav Para Term Preterm Abortions TAB SAB Ect Mult Living   1 0 0 0 0 0 0 0  0      Past Medical History  Diagnosis Date  . Endometriosis   . Chest pain, atypical     per pt ? from Ultram- upper right chest pain  . Headache(784.0)   . Anxiety   . Depression     no meds  . Shortness of breath     Past Surgical History  Procedure Laterality Date  . Laparoscopy  11/03/2010    Procedure: LAPAROSCOPY DIAGNOSTIC;  Surgeon: Loney Laurence;  Location: [facility];  Service: Gynecology;;  Chromopertubation    History reviewed. No pertinent family history.  History  Substance Use Topics  . Smoking status: Former Smoker    Types: Cigarettes  . Smokeless tobacco: Not on file  . Alcohol Use: No     Comment: occasional    Allergies: No Known Allergies  Prescriptions prior to admission  Medication Sig Dispense Refill  . cyclobenzaprine (FLEXERIL) 10 MG tablet Take 10 mg by mouth 3 (three) times daily as needed for muscle spasms.      Marland Kitchen ibuprofen (ADVIL,MOTRIN) 600 MG tablet Take 600 mg by mouth every 6 (six) hours as needed for moderate pain.      . Prenatal Vit-Fe Fumarate-FA (PRENATAL MULTIVITAMIN) TABS tablet Take 1 tablet by mouth daily at 12 noon.        ROS:  Dysuria and frequency  Physical Exam   Blood pressure 129/71, pulse 80, temperature 98.7 F (37.1 C),  temperature source Oral, resp. rate 18, height 5' 2.5" (1.588 m), weight 163 lb (73.936 kg), last menstrual period 07/19/2012, SpO2 99.00%.  Physical Exam No CVAT Abd gravid, NT FHR 145 bpm  Results for orders placed during the hospital encounter of 12/14/12 (from the past 24 hour(s))  URINALYSIS, ROUTINE W REFLEX MICROSCOPIC     Status: Abnormal   Collection Time    12/14/12  7:00 PM      Result Value Range   Color, Urine ORANGE (*) YELLOW   APPearance HAZY (*) CLEAR   Specific Gravity, Urine >1.030 (*) 1.005 - 1.030   pH 6.0  5.0 - 8.0   Glucose, UA 100 (*) NEGATIVE mg/dL   Hgb urine dipstick SMALL (*) NEGATIVE   Bilirubin Urine NEGATIVE  NEGATIVE   Ketones, ur 40 (*) NEGATIVE mg/dL   Protein, ur 130 (*) NEGATIVE mg/dL   Urobilinogen, UA 1.0  0.0 - 1.0 mg/dL   Nitrite POSITIVE (*) NEGATIVE   Leukocytes, UA TRACE (*) NEGATIVE  URINE MICROSCOPIC-ADD ON     Status: Abnormal   Collection Time    12/14/12  7:00 PM      Result Value Range   Squamous Epithelial / LPF FEW (*) RARE   WBC,  UA 3-6  <3 WBC/hpf   RBC / HPF 7-10  <3 RBC/hpf   Bacteria, UA MANY (*) RARE   Sent to culture  ED Course  IUP at 21 1/7 weeks UTI  Plan: D/C home with UTI instructions. Rx Keflex 500 mg po BID x 7 days Will f/u on urine culture. Keep scheduled appt at [facility] early December, and will call prn.   Nigel Bridgeman CNM, [place] 12/14/2012 8:09 PM

## 2012-12-14 NOTE — MAU Note (Signed)
Patient states she has had a lot of lower abdominal pain and pressure for the past  3 days. Has been taking an OTC medication for urinary spasms and has helped with the frequency but not with the pain. Denies vaginal bleeding, leaking or discharge. Has felt fetal movement.

## 2012-12-17 LAB — URINE CULTURE: Colony Count: >=100000

## 2013-01-23 LAB — OB RESULTS CONSOLE RPR: RPR: NONREACTIVE

## 2013-01-31 ENCOUNTER — Inpatient Hospital Stay (HOSPITAL_COMMUNITY)
Admission: AD | Admit: 2013-01-31 | Payer: BC Managed Care – PPO | Source: Ambulatory Visit | Admitting: Obstetrics and Gynecology

## 2013-04-03 ENCOUNTER — Inpatient Hospital Stay (HOSPITAL_COMMUNITY)
Admission: AD | Admit: 2013-04-03 | Discharge: 2013-04-03 | Disposition: A | Discharge status: Home / Self Care | Payer: BC Managed Care – PPO | Source: Ambulatory Visit | Attending: Obstetrics and Gynecology | Admitting: Obstetrics and Gynecology

## 2013-04-03 ENCOUNTER — Encounter (HOSPITAL_COMMUNITY): Payer: Self-pay | Admitting: *Deleted

## 2013-04-03 DIAGNOSIS — O368190 Decreased fetal movements, unspecified trimester, not applicable or unspecified: Secondary | ICD-10-CM | POA: Insufficient documentation

## 2013-04-03 DIAGNOSIS — F411 Generalized anxiety disorder: Secondary | ICD-10-CM | POA: Insufficient documentation

## 2013-04-03 DIAGNOSIS — M545 Low back pain, unspecified: Secondary | ICD-10-CM | POA: Insufficient documentation

## 2013-04-03 DIAGNOSIS — Z87891 Personal history of nicotine dependence: Secondary | ICD-10-CM | POA: Insufficient documentation

## 2013-04-03 DIAGNOSIS — O9989 Other specified diseases and conditions complicating pregnancy, childbirth and the puerperium: Principal | ICD-10-CM

## 2013-04-03 DIAGNOSIS — O99891 Other specified diseases and conditions complicating pregnancy: Secondary | ICD-10-CM | POA: Insufficient documentation

## 2013-04-03 MED ORDER — HYDROCODONE-ACETAMINOPHEN 5-325 MG PO TABS: 1.0000 | ORAL_TABLET | ORAL | Status: DC | PRN

## 2013-04-03 MED ORDER — HYDROCODONE-ACETAMINOPHEN 5-325 MG PO TABS
1.0000 | ORAL_TABLET | ORAL | Status: DC | PRN
  Administered 2013-04-03: 1 via ORAL
  Filled 2013-04-03: qty 1

## 2013-04-03 NOTE — MAU Note (Signed)
C/o sharp stabbing cramping in her lower back and pelvic pressure since Monday; has gotten progressively stronger since Monday; pain increases when pt moves around;

## 2013-04-03 NOTE — Discharge Instructions (Signed)
Fetal Movement Counts Patient Name: __________________________________________________ Patient Due Date: ____________________ Performing a fetal movement count is highly recommended in high-risk pregnancies, but it is good for every pregnant woman to do. Your caregiver may ask you to start counting fetal movements at 28 weeks of the pregnancy. Fetal movements often increase:  After eating a full meal.  After physical activity.  After eating or drinking something sweet or cold.  At rest. Pay attention to when you feel the baby is most active. This will help you notice a pattern of your baby's sleep and wake cycles and what factors contribute to an increase in fetal movement. It is important to perform a fetal movement count at the same time each day when your baby is normally most active.  HOW TO COUNT FETAL MOVEMENTS 1. Find a quiet and comfortable area to sit or lie down on your left side. Lying on your left side provides the best blood and oxygen circulation to your baby. 2. Write down the day and time on a sheet of paper or in a journal. 3. Start counting kicks, flutters, swishes, rolls, or jabs in a 2 hour period. You should feel at least 10 movements within 2 hours. 4. If you do not feel 10 movements in 2 hours, wait 2 3 hours and count again. Look for a change in the pattern or not enough counts in 2 hours. SEEK MEDICAL CARE IF:  You feel less than 10 counts in 2 hours, tried twice.  There is no movement in over an hour.  The pattern is changing or taking longer each day to reach 10 counts in 2 hours.  You feel the baby is not moving as he or she usually does. Date: ____________ Movements: ____________ Start time: ____________ Finish time: ____________  Date: ____________ Movements: ____________ Start time: ____________ Finish time: ____________ Date: ____________ Movements: ____________ Start time: ____________ Finish time: ____________ Date: ____________ Movements: ____________  Start time: ____________ Finish time: ____________ Date: ____________ Movements: ____________ Start time: ____________ Finish time: ____________ Date: ____________ Movements: ____________ Start time: ____________ Finish time: ____________ Date: ____________ Movements: ____________ Start time: ____________ Finish time: ____________ Date: ____________ Movements: ____________ Start time: ____________ Finish time: ____________  Date: ____________ Movements: ____________ Start time: ____________ Finish time: ____________ Date: ____________ Movements: ____________ Start time: ____________ Finish time: ____________ Date: ____________ Movements: ____________ Start time: ____________ Finish time: ____________ Date: ____________ Movements: ____________ Start time: ____________ Finish time: ____________ Date: ____________ Movements: ____________ Start time: ____________ Finish time: ____________ Date: ____________ Movements: ____________ Start time: ____________ Finish time: ____________ Date: ____________ Movements: ____________ Start time: ____________ Finish time: ____________  Date: ____________ Movements: ____________ Start time: ____________ Finish time: ____________ Date: ____________ Movements: ____________ Start time: ____________ Finish time: ____________ Date: ____________ Movements: ____________ Start time: ____________ Finish time: ____________ Date: ____________ Movements: ____________ Start time: ____________ Finish time: ____________ Date: ____________ Movements: ____________ Start time: ____________ Finish time: ____________ Date: ____________ Movements: ____________ Start time: ____________ Finish time: ____________ Date: ____________ Movements: ____________ Start time: ____________ Finish time: ____________  Date: ____________ Movements: ____________ Start time: ____________ Finish time: ____________ Date: ____________ Movements: ____________ Start time: ____________ Finish time:  ____________ Date: ____________ Movements: ____________ Start time: ____________ Finish time: ____________ Date: ____________ Movements: ____________ Start time: ____________ Finish time: ____________ Date: ____________ Movements: ____________ Start time: ____________ Finish time: ____________ Date: ____________ Movements: ____________ Start time: ____________ Finish time: ____________ Date: ____________ Movements: ____________ Start time: ____________ Finish time: ____________  Date: ____________ Movements: ____________ Start time: ____________ Finish   time: ____________ Date: ____________ Movements: ____________ Start time: ____________ Finish time: ____________ Date: ____________ Movements: ____________ Start time: ____________ Finish time: ____________ Date: ____________ Movements: ____________ Start time: ____________ Finish time: ____________ Date: ____________ Movements: ____________ Start time: ____________ Finish time: ____________ Date: ____________ Movements: ____________ Start time: ____________ Finish time: ____________ Date: ____________ Movements: ____________ Start time: ____________ Finish time: ____________  Date: ____________ Movements: ____________ Start time: ____________ Finish time: ____________ Date: ____________ Movements: ____________ Start time: ____________ Finish time: ____________ Date: ____________ Movements: ____________ Start time: ____________ Finish time: ____________ Date: ____________ Movements: ____________ Start time: ____________ Finish time: ____________ Date: ____________ Movements: ____________ Start time: ____________ Finish time: ____________ Date: ____________ Movements: ____________ Start time: ____________ Finish time: ____________ Date: ____________ Movements: ____________ Start time: ____________ Finish time: ____________  Date: ____________ Movements: ____________ Start time: ____________ Finish time: ____________ Date: ____________ Movements:  ____________ Start time: ____________ Finish time: ____________ Date: ____________ Movements: ____________ Start time: ____________ Finish time: ____________ Date: ____________ Movements: ____________ Start time: ____________ Finish time: ____________ Date: ____________ Movements: ____________ Start time: ____________ Finish time: ____________ Date: ____________ Movements: ____________ Start time: ____________ Finish time: ____________ Date: ____________ Movements: ____________ Start time: ____________ Finish time: ____________  Date: ____________ Movements: ____________ Start time: ____________ Finish time: ____________ Date: ____________ Movements: ____________ Start time: ____________ Finish time: ____________ Date: ____________ Movements: ____________ Start time: ____________ Finish time: ____________ Date: ____________ Movements: ____________ Start time: ____________ Finish time: ____________ Date: ____________ Movements: ____________ Start time: ____________ Finish time: ____________ Date: ____________ Movements: ____________ Start time: ____________ Finish time: ____________ Document Released: 02/09/2006 Document Revised: 12/28/2011 Document Reviewed: 11/07/2011 [employer] Patient Information 2014 [employer]  

## 2013-04-03 NOTE — MAU Provider Note (Signed)
History   28 yo G1P0 at 30 6/7 weeks presented after calling office this afternoon with low back pain, pelvic pressure, and decreased FM today.  Denies leaking or bleeding, unsure if she is contracting.    Patient Active Problem List   Diagnosis Date Noted  . 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 Depression 12/14/2012  . Generalized anxiety disorder 12/14/2012  . UTI (urinary tract infection) 12/14/2012  . BV (bacterial vaginosis) 03/11/2011     Chief Complaint  Patient presents with  . Labor Eval   HPI:  See above  OB History   Grav Para Term Preterm Abortions TAB SAB Ect Mult Living   1 0 0 0 0 0 0 0  0      Past Medical History  Diagnosis Date  . Endometriosis   . Chest pain, atypical     per pt ? from Ultram- upper right chest pain  . Headache(784.0)   . Anxiety   . Depression     no meds  . Shortness of breath     Past Surgical History  Procedure Laterality Date  . Laparoscopy  11/03/2010    Procedure: LAPAROSCOPY DIAGNOSTIC;  Surgeon: Loney Laurence;  Location: [facility];  Service: Gynecology;;  Chromopertubation    Family History  Problem Relation Age of Onset  . Alcohol abuse Neg Hx   . Arthritis Neg Hx   . Asthma Neg Hx   . Cancer Neg Hx   . Birth defects Neg Hx   . COPD Neg Hx   . Depression Neg Hx   . Diabetes Neg Hx   . Drug abuse Neg Hx   . Early death Neg Hx   . Hearing loss Neg Hx   . Heart disease Neg Hx   . Hyperlipidemia Neg Hx   . Hypertension Neg Hx   . Kidney disease Neg Hx   . Learning disabilities Neg Hx   . Mental illness Neg Hx   . Mental retardation Neg Hx   . Miscarriages / Stillbirths Neg Hx   . Stroke Neg Hx   . Vision loss Neg Hx   . Varicose Veins Neg Hx     History  Substance Use Topics  . Smoking status: Former Smoker    Types: Cigarettes  . Smokeless tobacco: Not on file  . Alcohol Use: No     Comment: occasional    Allergies: No Known Allergies  Prescriptions prior to admission  Medication Sig Dispense  Refill  . cyclobenzaprine (FLEXERIL) 10 MG tablet Take 10 mg by mouth 3 (three) times daily as needed for muscle spasms.      Marland Kitchen ibuprofen (ADVIL,MOTRIN) 600 MG tablet Take 600 mg by mouth every 6 (six) hours as needed for moderate pain.      . Prenatal Vit-Fe Fumarate-FA (PRENATAL MULTIVITAMIN) TABS tablet Take 1 tablet by mouth daily at 12 noon.        ROS:  Pelvic pressure, now noting FM, low back pain. Physical Exam   Blood pressure 116/68, pulse 68, temperature 98 F (36.7 C), temperature source Oral, resp. rate 18, height 5\' 2"  (1.575 m), weight 169 lb (76.658 kg), last menstrual period 07/19/2012.  Physical Exam Chest clear Heart RRR without murmur Abd gravid, NT Pelvic--cervix closed, firm, long, vtx, -2 Ext WNL  FHR Category 1, frequent accels UCs mild irritability at intervals ED Course  IUP at 36 6/7 weeks Musculoskeletal issues of 3rd trimester GBS negative Reactive NST, now aware of FM  Plan:  D/C home FKCs reviewed, labor s/s discussed Rx Vicodin for back pain, comfort measures reviewed. Support to patient for 3rd trimester discomforts. Keep scheduled appt at [facility] in 1 week.  Nigel BridgemanLATHAM, Namari Breton CNM, [place] 04/03/2013 8:22 PM

## 2013-04-24 ENCOUNTER — Telehealth (HOSPITAL_COMMUNITY): Payer: Self-pay | Admitting: *Deleted

## 2013-04-24 LAB — OB RESULTS CONSOLE GBS: GBS: NEGATIVE

## 2013-04-24 NOTE — Telephone Encounter (Signed)
Preadmission screen  

## 2013-04-25 ENCOUNTER — Telehealth (HOSPITAL_COMMUNITY): Payer: Self-pay | Admitting: *Deleted

## 2013-04-25 ENCOUNTER — Encounter (HOSPITAL_COMMUNITY): Payer: Self-pay | Admitting: *Deleted

## 2013-04-25 NOTE — Telephone Encounter (Signed)
Preadmission screen  

## 2013-04-28 ENCOUNTER — Inpatient Hospital Stay (HOSPITAL_COMMUNITY): Admission: RE | Admit: 2013-04-28 | Payer: BC Managed Care – PPO | Source: Ambulatory Visit

## 2013-05-01 ENCOUNTER — Encounter (HOSPITAL_COMMUNITY)
Admission: AD | Disposition: A | Discharge status: Home / Self Care | Payer: Self-pay | Source: Ambulatory Visit | Attending: Obstetrics and Gynecology

## 2013-05-01 ENCOUNTER — Encounter (HOSPITAL_COMMUNITY): Payer: BC Managed Care – PPO | Admitting: Anesthesiology

## 2013-05-01 ENCOUNTER — Encounter (HOSPITAL_COMMUNITY): Payer: Self-pay

## 2013-05-01 ENCOUNTER — Inpatient Hospital Stay (HOSPITAL_COMMUNITY)
Admission: AD | Admit: 2013-05-01 | Discharge: 2013-05-04 | DRG: 766 | Disposition: A | Discharge status: Home / Self Care | Payer: BC Managed Care – PPO | Source: Ambulatory Visit | Attending: Obstetrics and Gynecology | Admitting: Obstetrics and Gynecology

## 2013-05-01 ENCOUNTER — Inpatient Hospital Stay (HOSPITAL_COMMUNITY): Payer: BC Managed Care – PPO | Admitting: Anesthesiology

## 2013-05-01 DIAGNOSIS — F341 Dysthymic disorder: Secondary | ICD-10-CM | POA: Diagnosis present

## 2013-05-01 DIAGNOSIS — Z3490 Encounter for supervision of normal pregnancy, unspecified, unspecified trimester: Secondary | ICD-10-CM

## 2013-05-01 DIAGNOSIS — O99344 Other mental disorders complicating childbirth: Secondary | ICD-10-CM | POA: Diagnosis present

## 2013-05-01 DIAGNOSIS — D649 Anemia, unspecified: Secondary | ICD-10-CM | POA: Diagnosis not present

## 2013-05-01 DIAGNOSIS — Z98891 History of uterine scar from previous surgery: Secondary | ICD-10-CM

## 2013-05-01 DIAGNOSIS — O9903 Anemia complicating the puerperium: Secondary | ICD-10-CM | POA: Diagnosis not present

## 2013-05-01 LAB — CBC
HEMATOCRIT: 32.8 % — AB (ref 36.0–46.0)
Hemoglobin: 10.9 g/dL — ABNORMAL LOW (ref 12.0–15.0)
MCH: 28 pg (ref 26.0–34.0)
MCHC: 33.2 g/dL (ref 30.0–36.0)
MCV: 84.3 fL (ref 78.0–100.0)
PLATELETS: 165 10*3/uL (ref 150–400)
RBC: 3.89 MIL/uL (ref 3.87–5.11)
RDW: 13.9 % (ref 11.5–15.5)
WBC: 8.1 10*3/uL (ref 4.0–10.5)

## 2013-05-01 LAB — RPR

## 2013-05-01 SURGERY — Surgical Case
Anesthesia: Epidural | Site: Abdomen

## 2013-05-01 MED ORDER — ONDANSETRON HCL 4 MG/2ML IJ SOLN
4.0000 mg | Freq: Four times a day (QID) | INTRAMUSCULAR | Status: DC | PRN
  Administered 2013-05-01: 4 mg via INTRAVENOUS
  Filled 2013-05-01: qty 2

## 2013-05-01 MED ORDER — KETOROLAC TROMETHAMINE 30 MG/ML IJ SOLN: 30.0000 mg | Freq: Four times a day (QID) | INTRAMUSCULAR | Status: AC | PRN

## 2013-05-01 MED ORDER — LACTATED RINGERS IV SOLN: 500.0000 mL | INTRAVENOUS | Status: DC | PRN

## 2013-05-01 MED ORDER — PHENYLEPHRINE 40 MCG/ML (10ML) SYRINGE FOR IV PUSH (FOR BLOOD PRESSURE SUPPORT)
80.0000 ug | PREFILLED_SYRINGE | INTRAVENOUS | Status: DC | PRN
  Filled 2013-05-01: qty 10

## 2013-05-01 MED ORDER — ONDANSETRON HCL 4 MG/2ML IJ SOLN
INTRAMUSCULAR | Status: DC | PRN
Start: 2013-05-01 — End: 2013-05-01
  Administered 2013-05-01: 4 mg via INTRAVENOUS

## 2013-05-01 MED ORDER — CEFAZOLIN SODIUM-DEXTROSE 2-3 GM-% IV SOLR
INTRAVENOUS | Status: DC | PRN
  Administered 2013-05-01: 2 g via INTRAVENOUS

## 2013-05-01 MED ORDER — MORPHINE SULFATE (PF) 0.5 MG/ML IJ SOLN
INTRAMUSCULAR | Status: DC | PRN
  Administered 2013-05-01: 4 mg via EPIDURAL

## 2013-05-01 MED ORDER — LIDOCAINE HCL (PF) 1 % IJ SOLN
INTRAMUSCULAR | Status: DC | PRN
  Administered 2013-05-01 (×2): 9 mL

## 2013-05-01 MED ORDER — SODIUM BICARBONATE 8.4 % IV SOLN
INTRAVENOUS | Status: DC | PRN
  Administered 2013-05-01 (×3): 5 mL via EPIDURAL

## 2013-05-01 MED ORDER — MEPERIDINE HCL 25 MG/ML IJ SOLN: 6.2500 mg | INTRAMUSCULAR | Status: DC | PRN

## 2013-05-01 MED ORDER — FLEET ENEMA 7-19 GM/118ML RE ENEM: 1.0000 | ENEMA | RECTAL | Status: DC | PRN

## 2013-05-01 MED ORDER — LACTATED RINGERS IV SOLN: INTRAVENOUS | Status: DC

## 2013-05-01 MED ORDER — PHENYLEPHRINE 40 MCG/ML (10ML) SYRINGE FOR IV PUSH (FOR BLOOD PRESSURE SUPPORT)
PREFILLED_SYRINGE | INTRAVENOUS | Status: AC
  Filled 2013-05-01: qty 5

## 2013-05-01 MED ORDER — DIPHENHYDRAMINE HCL 50 MG/ML IJ SOLN: 12.5000 mg | INTRAMUSCULAR | Status: DC | PRN

## 2013-05-01 MED ORDER — MEPERIDINE HCL 25 MG/ML IJ SOLN
INTRAMUSCULAR | Status: DC | PRN
  Administered 2013-05-01: 25 mg via INTRAVENOUS

## 2013-05-01 MED ORDER — LIDOCAINE-EPINEPHRINE (PF) 2 %-1:200000 IJ SOLN
INTRAMUSCULAR | Status: AC
  Filled 2013-05-01: qty 20

## 2013-05-01 MED ORDER — FENTANYL 2.5 MCG/ML BUPIVACAINE 1/10 % EPIDURAL INFUSION (WH - ANES)
14.0000 mL/h | INTRAMUSCULAR | Status: DC | PRN
  Administered 2013-05-01: 14 mL/h via EPIDURAL
  Filled 2013-05-01 (×2): qty 125

## 2013-05-01 MED ORDER — ACETAMINOPHEN 325 MG PO TABS: 650.0000 mg | ORAL_TABLET | ORAL | Status: DC | PRN

## 2013-05-01 MED ORDER — PROMETHAZINE HCL 25 MG/ML IJ SOLN: 6.2500 mg | INTRAMUSCULAR | Status: DC | PRN

## 2013-05-01 MED ORDER — MEPERIDINE HCL 25 MG/ML IJ SOLN
INTRAMUSCULAR | Status: AC
  Filled 2013-05-01: qty 1

## 2013-05-01 MED ORDER — MORPHINE SULFATE 0.5 MG/ML IJ SOLN
INTRAMUSCULAR | Status: AC
  Filled 2013-05-01: qty 10

## 2013-05-01 MED ORDER — CEFAZOLIN SODIUM-DEXTROSE 2-3 GM-% IV SOLR
INTRAVENOUS | Status: AC
  Filled 2013-05-01: qty 50

## 2013-05-01 MED ORDER — CITRIC ACID-SODIUM CITRATE 334-500 MG/5ML PO SOLN
30.0000 mL | ORAL | Status: DC | PRN
  Administered 2013-05-01: 30 mL via ORAL
  Filled 2013-05-01: qty 15

## 2013-05-01 MED ORDER — EPHEDRINE 5 MG/ML INJ: 10.0000 mg | INTRAVENOUS | Status: DC | PRN

## 2013-05-01 MED ORDER — OXYTOCIN 40 UNITS IN LACTATED RINGERS INFUSION - SIMPLE MED: 62.5000 mL/h | INTRAVENOUS | Status: DC

## 2013-05-01 MED ORDER — EPHEDRINE 5 MG/ML INJ
10.0000 mg | INTRAVENOUS | Status: DC | PRN
  Filled 2013-05-01: qty 4

## 2013-05-01 MED ORDER — MORPHINE SULFATE (PF) 0.5 MG/ML IJ SOLN
INTRAMUSCULAR | Status: DC | PRN
  Administered 2013-05-01: 1 mg via INTRAVENOUS

## 2013-05-01 MED ORDER — ONDANSETRON HCL 4 MG/2ML IJ SOLN
INTRAMUSCULAR | Status: AC
  Filled 2013-05-01: qty 2

## 2013-05-01 MED ORDER — OXYTOCIN 10 UNIT/ML IJ SOLN
40.0000 [IU] | INTRAVENOUS | Status: DC | PRN
  Administered 2013-05-01: 40 [IU] via INTRAVENOUS

## 2013-05-01 MED ORDER — TERBUTALINE SULFATE 1 MG/ML IJ SOLN: 0.2500 mg | Freq: Once | INTRAMUSCULAR | Status: AC | PRN

## 2013-05-01 MED ORDER — FENTANYL CITRATE 0.05 MG/ML IJ SOLN
100.0000 ug | INTRAMUSCULAR | Status: DC | PRN
  Administered 2013-05-01 (×3): 100 ug via INTRAVENOUS
  Filled 2013-05-01: qty 2

## 2013-05-01 MED ORDER — HYDROMORPHONE HCL PF 1 MG/ML IJ SOLN
INTRAMUSCULAR | Status: AC
  Filled 2013-05-01: qty 1

## 2013-05-01 MED ORDER — KETOROLAC TROMETHAMINE 30 MG/ML IJ SOLN
INTRAMUSCULAR | Status: AC
  Filled 2013-05-01: qty 1

## 2013-05-01 MED ORDER — PHENYLEPHRINE 40 MCG/ML (10ML) SYRINGE FOR IV PUSH (FOR BLOOD PRESSURE SUPPORT): 80.0000 ug | PREFILLED_SYRINGE | INTRAVENOUS | Status: DC | PRN

## 2013-05-01 MED ORDER — FENTANYL 2.5 MCG/ML BUPIVACAINE 1/10 % EPIDURAL INFUSION (WH - ANES)
INTRAMUSCULAR | Status: DC | PRN
  Administered 2013-05-01: 14 mL/h via EPIDURAL

## 2013-05-01 MED ORDER — SCOPOLAMINE 1 MG/3DAYS TD PT72
1.0000 | MEDICATED_PATCH | Freq: Once | TRANSDERMAL | Status: DC
  Administered 2013-05-01: 1.5 mg via TRANSDERMAL

## 2013-05-01 MED ORDER — HYDROMORPHONE HCL PF 1 MG/ML IJ SOLN
0.2500 mg | INTRAMUSCULAR | Status: DC | PRN
  Administered 2013-05-01 (×2): 0.5 mg via INTRAVENOUS

## 2013-05-01 MED ORDER — OXYCODONE-ACETAMINOPHEN 5-325 MG PO TABS: 1.0000 | ORAL_TABLET | ORAL | Status: DC | PRN

## 2013-05-01 MED ORDER — LACTATED RINGERS IV SOLN
INTRAVENOUS | Status: DC
  Administered 2013-05-01 (×5): via INTRAVENOUS

## 2013-05-01 MED ORDER — SCOPOLAMINE 1 MG/3DAYS TD PT72
MEDICATED_PATCH | TRANSDERMAL | Status: AC
  Administered 2013-05-01: 1.5 mg via TRANSDERMAL
  Filled 2013-05-01: qty 1

## 2013-05-01 MED ORDER — SODIUM BICARBONATE 8.4 % IV SOLN
INTRAVENOUS | Status: AC
  Filled 2013-05-01: qty 50

## 2013-05-01 MED ORDER — PHENYLEPHRINE HCL 10 MG/ML IJ SOLN
INTRAMUSCULAR | Status: DC | PRN
  Administered 2013-05-01 (×2): 40 ug via INTRAVENOUS

## 2013-05-01 MED ORDER — LACTATED RINGERS IV SOLN: 500.0000 mL | Freq: Once | INTRAVENOUS | Status: DC

## 2013-05-01 MED ORDER — LIDOCAINE HCL (PF) 1 % IJ SOLN: 30.0000 mL | INTRAMUSCULAR | Status: DC | PRN

## 2013-05-01 MED ORDER — FENTANYL CITRATE 0.05 MG/ML IJ SOLN
100.0000 ug | INTRAMUSCULAR | Status: DC | PRN
  Filled 2013-05-01 (×3): qty 2

## 2013-05-01 MED ORDER — OXYTOCIN 40 UNITS IN LACTATED RINGERS INFUSION - SIMPLE MED
1.0000 m[IU]/min | INTRAVENOUS | Status: DC
Start: 2013-05-01 — End: 2013-05-02
  Administered 2013-05-01: 2 m[IU]/min via INTRAVENOUS
  Filled 2013-05-01: qty 1000

## 2013-05-01 MED ORDER — KETOROLAC TROMETHAMINE 30 MG/ML IJ SOLN: 15.0000 mg | Freq: Once | INTRAMUSCULAR | Status: AC | PRN

## 2013-05-01 MED ORDER — IBUPROFEN 600 MG PO TABS: 600.0000 mg | ORAL_TABLET | Freq: Four times a day (QID) | ORAL | Status: DC | PRN

## 2013-05-01 MED ORDER — LACTATED RINGERS IV SOLN
INTRAVENOUS | Status: DC
  Administered 2013-05-01: 17:00:00 via INTRAUTERINE

## 2013-05-01 MED ORDER — OXYTOCIN BOLUS FROM INFUSION: 500.0000 mL | INTRAVENOUS | Status: DC

## 2013-05-01 MED ORDER — OXYTOCIN 10 UNIT/ML IJ SOLN
INTRAMUSCULAR | Status: AC
  Filled 2013-05-01: qty 4

## 2013-05-01 SURGICAL SUPPLY — 36 items
BENZOIN TINCTURE PRP APPL 2/3 (GAUZE/BANDAGES/DRESSINGS) ×3 IMPLANT
CLAMP CORD UMBIL (MISCELLANEOUS) IMPLANT
CLOSURE WOUND 1/2 X4 (GAUZE/BANDAGES/DRESSINGS) ×1
CLOTH BEACON ORANGE TIMEOUT ST (SAFETY) ×3 IMPLANT
CONTAINER PREFILL 10% NBF 15ML (MISCELLANEOUS) IMPLANT
DERMABOND ADVANCED (GAUZE/BANDAGES/DRESSINGS) ×2
DERMABOND ADVANCED .7 DNX12 (GAUZE/BANDAGES/DRESSINGS) ×1 IMPLANT
DRAPE LG THREE QUARTER DISP (DRAPES) IMPLANT
DRSG OPSITE POSTOP 4X10 (GAUZE/BANDAGES/DRESSINGS) ×3 IMPLANT
DURAPREP 26ML APPLICATOR (WOUND CARE) ×3 IMPLANT
ELECT REM PT RETURN 9FT ADLT (ELECTROSURGICAL) ×3
ELECTRODE REM PT RTRN 9FT ADLT (ELECTROSURGICAL) ×1 IMPLANT
EXTRACTOR VACUUM M CUP 4 TUBE (SUCTIONS) IMPLANT
EXTRACTOR VACUUM M CUP 4' TUBE (SUCTIONS)
GLOVE BIO SURGEON STRL SZ7.5 (GLOVE) ×3 IMPLANT
GLOVE BIOGEL PI IND STRL 7.5 (GLOVE) ×1 IMPLANT
GLOVE BIOGEL PI INDICATOR 7.5 (GLOVE) ×2
GOWN STRL REUS W/TWL LRG LVL3 (GOWN DISPOSABLE) ×6 IMPLANT
KIT ABG SYR 3ML LUER SLIP (SYRINGE) IMPLANT
NEEDLE HYPO 25X5/8 SAFETYGLIDE (NEEDLE) IMPLANT
NS IRRIG 1000ML POUR BTL (IV SOLUTION) ×3 IMPLANT
PACK C SECTION WH (CUSTOM PROCEDURE TRAY) ×3 IMPLANT
PAD OB MATERNITY 4.3X12.25 (PERSONAL CARE ITEMS) ×3 IMPLANT
RETRACTOR WND ALEXIS 25 LRG (MISCELLANEOUS) ×1 IMPLANT
RTRCTR WOUND ALEXIS 25CM LRG (MISCELLANEOUS) ×3
STRIP CLOSURE SKIN 1/2X4 (GAUZE/BANDAGES/DRESSINGS) ×2 IMPLANT
SUT CHROMIC 2 0 CT 1 (SUTURE) ×3 IMPLANT
SUT MNCRL AB 3-0 PS2 27 (SUTURE) ×3 IMPLANT
SUT PLAIN 0 NONE (SUTURE) IMPLANT
SUT PLAIN 2 0 XLH (SUTURE) ×3 IMPLANT
SUT VIC AB 0 CT1 36 (SUTURE) ×3 IMPLANT
SUT VIC AB 0 CTX 36 (SUTURE) ×8
SUT VIC AB 0 CTX36XBRD ANBCTRL (SUTURE) ×4 IMPLANT
TOWEL OR 17X24 6PK STRL BLUE (TOWEL DISPOSABLE) ×3 IMPLANT
TRAY FOLEY CATH 14FR (SET/KITS/TRAYS/PACK) ×3 IMPLANT
WATER STERILE IRR 1000ML POUR (IV SOLUTION) IMPLANT

## 2013-05-01 NOTE — Progress Notes (Signed)
  Subjective: Patient comfortable with epidural.  Objective: BP 145/88  Pulse 56  Temp(Src) 98.1 F (36.7 C) (Oral)  Resp 18  Ht 5\' 2"  (1.575 m)  Wt 176 lb 6.4 oz (80.015 kg)  BMI 32.26 kg/m2  LMP 07/19/2012   Total I/O In: -  Out: 850 [Urine:850]  FHT: 120 bpm, Mod Var, -Decels, +Accels UC:  Irregular, mild SVE:   Dilation: 5 Effacement (%): 100 Station: -2 Exam by:: AT&TJessica Shabre Kreher, cnm IUPC inserted w/o difficulty FSE in place  Assessment:  IUP at 40.6wks Inadequate Contraction Cat I FT  Plan: Start pitocin augmentation, now IUPC inserted  Continue to monitor Reassess Q2Hrs or prn  Joyice FasterJessica Lynn [facility] 05/01/2013, 3:34 PM

## 2013-05-01 NOTE — Transfer of Care (Signed)
Immediate Anesthesia Transfer of Care Note  Patient: Donna FifeMichelle Mcgriff  Procedure(s) Performed: Procedure(s): CESAREAN SECTION (N/A)  Patient Location: PACU  Anesthesia Type:Epidural  Level of Consciousness: awake  Airway & Oxygen Therapy: Patient Spontanous Breathing  Post-op Assessment: Report given to PACU RN  Post vital signs: Reviewed and stable  Complications: No apparent anesthesia complications

## 2013-05-01 NOTE — Progress Notes (Signed)
  Subjective: Patient comfortable with epidural. Family at bedside.  Objective: BP 139/84  Pulse 55  Temp(Src) 97.7 F (36.5 C) (Oral)  Resp 16  Ht 5\' 2"  (1.575 m)  Wt 176 lb 6.4 oz (80.015 kg)  BMI 32.26 kg/m2  LMP 07/19/2012     FHT:  120 bpm, Mod Var, -Decels, +Accels UC:   Irregular SVE:   Dilation: 4 Effacement (%): 100 Station: -2 Exam by:: AT&TJessica Antoria Lanza, cnm AROM; clear, moderate amt  Assessment:  IUP at 40.6wks Early Labor GBS Negative Amniotomy  Plan: Continue present mgmt Reassess in 2 hours or prn Dr. Dorris CarnesN. Dillard updated  Joyice FasterJessica Lynn [facility] 05/01/2013, 9:56 AM

## 2013-05-01 NOTE — Progress Notes (Signed)
  Subjective: Pt is managing UCs with Fentanyl.  Family at bedside.  Objective: BP 141/89  Pulse 109  Temp(Src) 98.5 F (36.9 C) (Oral)  Resp 16  Ht 5\' 8"  (1.727 m)  Wt 314 lb (142.429 kg)  BMI 47.75 kg/m2  SpO2 100%  LMP 07/23/2012      FHT: Category II UC:   irregular, every 4-8 minutes  Dilation: 3 Effacement (%): 100 Cervical Position: Anterior Station: -2 Presentation: Vertex Exam by:: j. Kimmarie Pascale cnm   Spontaneous labor, progressing normally  Labor: Progressing normally  Preeclampsia: no signs or symptoms of toxicity  Fetal Wellbeing: Category II - position changes Pain Control: Fentanyl  I/D: GBS neg; Intact; Afebrile  Anticipated MOD: NSVD    Donna Alvarado 04/27/2013, 7:23 AM

## 2013-05-01 NOTE — Progress Notes (Signed)
  Subjective: Pt is comfortable with epidural.  Family at bedside and supportive.  R&B of c/s were reviewed with the patient, including bleeding, infection, and damage to other organs -- patient verbalizes understanding of these risks and wishes to proceed   Objective: BP 141/89  Pulse 109  Temp(Src) 98.5 F (36.9 C) (Oral)  Resp 16  Ht 5\' 8"  (1.727 m)  Wt 314 lb (142.429 kg)  BMI 47.75 kg/m2  SpO2 100%  LMP 07/23/2012      FHT: Category II UC:   regular, every 1-6 minutes  Dilation: 5 Effacement (%): 80 Cervical Position: Anterior Station: -3 Presentation: Vertex Exam by:: Victorino DikeJennifer, CNM    Protracted latent phase with NRFHT  Labor: Reviewed FHT with Dr. Su Hiltoberts. Asked to discuss with pt c/s R&B, pt voiced understanding and wishes to proceed with c/s.  Preeclampsia: no signs or symptoms of toxicity and elevated BP will discuss with Dr. Su Hiltoberts about drawing PIH labs  Fetal Wellbeing: Category II  Pain Control: Epidural  I/D: GBS neg; AROM at 0933 with clear fluid; Afebrile  Anticipated MOD: C/s    Donna Alvarado 04/27/2013, 7:23 AM

## 2013-05-01 NOTE — Progress Notes (Signed)
  Subjective: Nurse called reporting fetal decels.  Objective: BP 128/72  Pulse 60  Temp(Src) 98.1 F (36.7 C) (Oral)  Resp 18  Ht 5\' 2"  (1.575 m)  Wt 176 lb 6.4 oz (80.015 kg)  BMI 32.26 kg/m2  LMP 07/19/2012   Total I/O In: -  Out: 850 [Urine:850]  FHT:  130 bpm, Mod Var, +Variable Decels, +Accels UC:   Q2-514min, mild to moderate SVE:   Dilation: 4.5 Effacement (%): 80;70 Station: -3 Exam by:: DR Dillard IUPC & FSE in place Pitocin at 286mUn/min  Assessment:  IUP at 40.6wks Cat II FT Inadequate contractions  Plan: Position change Amnioinfusion Pitocin off Reassess in 2 hrs or prn Dr. Audree CamelN. Dillard consulted and discussed POC.   Donna Alvarado, CNM, MSN 05/01/2013, 4:42 PM

## 2013-05-01 NOTE — Anesthesia Postprocedure Evaluation (Signed)
Anesthesia Post Note  Patient: Larita FifeMichelle Bontempo  Procedure(s) Performed: Procedure(s) (LRB): CESAREAN SECTION (N/A)  Anesthesia type: Epidural  Patient location: PACU  Post pain: Pain level controlled  Post assessment: Post-op Vital signs reviewed  Last Vitals:  Filed Vitals:   05/01/13 2245  BP: 91/71  Pulse: 59  Temp:   Resp: 14    Post vital signs: Reviewed  Level of consciousness: awake  Complications: No apparent anesthesia complications

## 2013-05-01 NOTE — H&P (Signed)
Donna Alvarado is a 28 y.o. female, G1P0 at 7524w6d, presenting for active labor.  Denies LOF, recent fever, resp or GI c/o's, UTI or PIH s/s. GFM. Desires epidural.  Elective IOL scheduled for tonight.  Patient Active Problem List   Diagnosis Date Noted  . Pregnancy 05/01/2013  . 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 Depression 12/14/2012  . Generalized anxiety disorder 12/14/2012  . UTI (urinary tract infection) 12/14/2012  . BV (bacterial vaginosis) 03/11/2011    History of present pregnancy: Patient entered care at 10 weeks.   EDC of 04/25/13 was established by LMP.   Anatomy scan: 19 weeks, limited with normal findings and an anterior placenta.   Additional US evaluations:  22 weeks - anatomy scan completed .   Significant prenatal events:  none   Last evaluation:  04/23/13 at 7219w5d  0cm / 50% / -3  OB History   Grav Para Term Preterm Abortions TAB SAB Ect Mult Living   1 0 0 0 0 0 0 0  0     Past Medical History  Diagnosis Date  . Endometriosis   . Chest pain, atypical     per pt ? from Ultram- upper right chest pain  . Headache(784.0)   . Anxiety   . Depression     no meds  . Shortness of breath    Past Surgical History  Procedure Laterality Date  . Laparoscopy  11/03/2010    Procedure: LAPAROSCOPY DIAGNOSTIC;  Surgeon: Loney LaurenceMichelle A Horvath;  Location: [facility];  Service: Gynecology;;  Chromopertubation   Family History: family history is negative for Alcohol abuse, Arthritis, Asthma, Cancer, Birth defects, COPD, Depression, Diabetes, Drug abuse, Early death, Hearing loss, Heart disease, Hyperlipidemia, Hypertension, Kidney disease, Learning disabilities, Mental illness, Mental retardation, Miscarriages / Stillbirths, Stroke, Vision loss, and Varicose Veins. Social History:  reports that she quit smoking about 10 months ago. Her smoking use included Cigarettes. She smoked 0.00 packs per day. She has never used smokeless tobacco. She reports that she does not drink alcohol or use  illicit drugs.   Prenatal Transfer Tool  Maternal Diabetes: No Genetic Screening: Normal Maternal Ultrasounds/Referrals: Normal Fetal Ultrasounds or other Referrals:  None Maternal Substance Abuse:  No Significant Maternal Medications:  None Significant Maternal Lab Results: Lab values include: Group B Strep negative    ROS: see HPI above, all other systems are negative   No Known Allergies   Dilation: 2 Effacement (%): 100 Exam by:: Haroldine LawsJennifer Seline Enzor CNM Blood pressure 149/86, pulse 56, resp. rate 20, height 5\' 2"  (1.575 m), weight 176 lb 6.4 oz (80.015 kg), last menstrual period 07/19/2012.  Chest clear Heart RRR without murmur Abd gravid, NT Ext: WNL  FHR: Cat I UCs:  Q 4-8 min  Prenatal labs: ABO, Rh: --/--/O POS, O POS (09/03 1900) Antibody: NEG (09/03 1900) Rubella:   Immune RPR: Nonreactive (12/31 0000)  HBsAg: Negative (11/12 0000)  HIV: Non-reactive (11/12 0000)  GBS: Negative (04/01 0000) Sickle cell/Hgb electrophoresis:  n/a Pap:  10/22/12 ASCUS GC:  Neg  Chlamydia:  Neg  Genetic screenings:  1st trimester and AFP normal Glucola:  97 Other:  none    Assessment IUP at 3524w6d Labor Planned elective IOL tonight - R&B discussed at ROB GBS neg  Admit to BS per c/w Dr Richardson Doppole Routine L&D orders Pitocin augmentation prn Epidural prn    Gray BernhardtJennifer OxleyCNM, MSN 05/01/2013, 3:58 AM

## 2013-05-01 NOTE — Progress Notes (Signed)
  Subjective:  Patient remains comfortable.   Objective: BP 140/55  Pulse 55  Temp(Src) 98.1 F (36.7 C) (Oral)  Resp 18  Ht 5\' 2"  (1.575 m)  Wt 176 lb 6.4 oz (80.015 kg)  BMI 32.26 kg/m2  LMP 07/19/2012   Total I/O In: -  Out: 850 [Urine:850]  FHT: 120 bpm, Mod Var, +Variable Decels, +Accels UC:   Q2-224min, moderate SVE:   Dilation: 5 Effacement (%): 100 Station: -2 Exam by:: Gerrit HeckJessica Ladonne Sharples, cnm Pitocin at 16mUn/min  Assessment:  IUP at 40.6wks Cat II FT Pitocin Augmentation  Plan: Position change IV Bolus Continue to monitor Reassess in 2 hours or prn  Marlene BastJessica Lynn Evani Shrider , CNM, MSN 05/01/2013, 2:53 PM  1530 Cat II FT remains.  Nurse called instructed to turn pitocin down to 824mUn/min and monitor.  IV bolus continues to infuse. Reassess prn.

## 2013-05-01 NOTE — Anesthesia Preprocedure Evaluation (Signed)
Anesthesia Evaluation  Patient identified by MRN, date of birth, ID band Patient awake    Reviewed: Allergy & Precautions, H&P , NPO status , Patient's Chart, lab work & pertinent test results  Airway Mallampati: I TM Distance: >3 FB Neck ROM: full    Dental no notable dental hx.    Pulmonary former smoker,  breath sounds clear to auscultation  Pulmonary exam normal       Cardiovascular negative cardio ROS      Neuro/Psych negative psych ROS   GI/Hepatic negative GI ROS, Neg liver ROS,   Endo/Other  negative endocrine ROS  Renal/GU negative Renal ROS     Musculoskeletal   Abdominal Normal abdominal exam  (+)   Peds  Hematology negative hematology ROS (+)   Anesthesia Other Findings   Reproductive/Obstetrics (+) Pregnancy                           Anesthesia Physical Anesthesia Plan  ASA: II  Anesthesia Plan: Epidural   Post-op Pain Management:    Induction:   Airway Management Planned:   Additional Equipment:   Intra-op Plan:   Post-operative Plan:   Informed Consent: I have reviewed the patients History and Physical, chart, labs and discussed the procedure including the risks, benefits and alternatives for the proposed anesthesia with the patient or authorized representative who has indicated his/her understanding and acceptance.     Plan Discussed with:   Anesthesia Plan Comments:         Anesthesia Quick Evaluation

## 2013-05-01 NOTE — Anesthesia Procedure Notes (Signed)
Epidural Patient location during procedure: OB Start time: 05/01/2013 7:34 AM End time: 05/01/2013 7:38 AM  Staffing Anesthesiologist: Leilani AbleHATCHETT, Roshana Shuffield Performed by: anesthesiologist   Preanesthetic Checklist Completed: patient identified, surgical consent, pre-op evaluation, timeout performed, IV checked, risks and benefits discussed and monitors and equipment checked  Epidural Patient position: sitting Prep: site prepped and draped and DuraPrep Patient monitoring: continuous pulse ox and blood pressure Approach: midline Location: L3-L4 Injection technique: LOR air  Needle:  Needle type: Tuohy  Needle gauge: 17 G Needle length: 9 cm and 9 Needle insertion depth: 5 cm cm Catheter type: closed end flexible Catheter size: 19 Gauge Catheter at skin depth: 10 cm Test dose: negative  Assessment Sensory level: T9 Events: blood not aspirated, injection not painful, no injection resistance, negative IV test and no paresthesia  Additional Notes Reason for block:procedure for pain

## 2013-05-01 NOTE — MAU Note (Signed)
Contractions all day. Some bloody show. Positive fetal movement.

## 2013-05-01 NOTE — Op Note (Addendum)
Cesarean Section Procedure Note  Indications: P0 at 8040 6/7wks with failure to progress and fetal intolerance of labor  Pre-operative Diagnosis: Arrest ot Dilation, Fetal Intolerance to Labor   Post-operative Diagnosis: Arrest ot Dilation, Fetal Intolerance to Labor  Procedure: CESAREAN SECTION  Surgeon: Purcell NailsAngela Y Jw Covin, MD    Assistants: Haroldine LawsJennifer Oxley, CNM  Anesthesia: Epidural  Anesthesiologist: Leilani AbleFranklin Hatchett, MD   Procedure Details  The patient was taken to the operating room secondary to fetal intolerance of labor after the risks, benefits, complications, treatment options, and expected outcomes were discussed with the patient.  The patient concurred with the proposed plan, giving informed consent which was signed and witnessed. The patient was taken to Operating Room C-Section Suite, identified as Donna Alvarado and the procedure verified as C-Section Delivery. A Time Out was held and the above information confirmed.  After induction of anesthesia by obtaining a spinal, the patient was prepped and draped in the usual sterile manner. A Pfannenstiel skin incision was made and carried down through the subcutaneous tissue to the underlying layer of fascia.  The fascia was incised bilaterally and extended transversely bilaterally with the Mayo scissors. Kocher clamps were placed on the inferior aspect of the fascial incision and the underlying rectus muscle was separated from the fascia. The same was done on the superior aspect of the fascial incision.  The peritoneum was identified, entered bluntly and extended manually.  An Alexis self-retaining retractor was placed.  The utero-vesical peritoneal reflection was incised transversely and the bladder flap was bluntly freed from the lower uterine segment. A low transverse uterine incision was made with the scalpel and extended bilaterally with the bandage scissors.  The infant was delivered in vertex position without difficulty in direct  occiput posterior presentation and a nuchal cord.  After the umbilical cord was clamped and cut, the infant was handed to the awaiting pediatricians.  Cord blood was obtained for evaluation.  The placenta was removed intact and appeared to be within normal limits. The uterus was cleared of all clots and debris. The uterine incision was closed with running interlocking sutures of 0 Vicryl and a second imbricating layer was performed as well.   Bilateral tubes and ovaries appeared to be within normal limits.  Good hemostasis was noted.  Copious irrigation was performed until clear.  The peritoneum was repaired with 2-0 chromic via a running suture.  The fascia was reapproximated with a running suture of 0 Vicryl. The subcutaneous tissue was reapproximated with 3 interrupted sutures of 2-0 plain.  The skin was reapproximated with a subcuticular suture of 3-0 monocryl.  Steristrips were applied.  Instrument, sponge, and needle counts were correct prior to abdominal closure and at the conclusion of the case.  The patient was awaiting transfer to the recovery room in good condition.  Findings: Live female infant with Apgars 8 at one minute and 9 at five minutes.  Normal appearing bilateral ovaries and fallopian tubes were noted.  Estimated Blood Loss:  800 ml         Drains: foley to gravity 200 cc         Total IV Fluids: 1800 ml         Specimens to Pathology: Placenta         Complications:  None; patient tolerated the procedure well.         Disposition: PACU - hemodynamically stable.         Condition: stable  Attending Attestation: I performed the procedure.

## 2013-05-02 ENCOUNTER — Encounter (HOSPITAL_COMMUNITY): Payer: Self-pay

## 2013-05-02 ENCOUNTER — Inpatient Hospital Stay (HOSPITAL_COMMUNITY)
Admission: RE | Admit: 2013-05-02 | Discharge: 2013-05-02 | Disposition: A | Discharge status: Home / Self Care | Payer: BC Managed Care – PPO | Source: Ambulatory Visit | Attending: Obstetrics and Gynecology | Admitting: Obstetrics and Gynecology

## 2013-05-02 DIAGNOSIS — Z98891 History of uterine scar from previous surgery: Secondary | ICD-10-CM

## 2013-05-02 LAB — CBC
HEMATOCRIT: 26.2 % — AB (ref 36.0–46.0)
HEMOGLOBIN: 8.5 g/dL — AB (ref 12.0–15.0)
MCH: 27.4 pg (ref 26.0–34.0)
MCHC: 32.4 g/dL (ref 30.0–36.0)
MCV: 84.5 fL (ref 78.0–100.0)
Platelets: 135 10*3/uL — ABNORMAL LOW (ref 150–400)
RBC: 3.1 MIL/uL — AB (ref 3.87–5.11)
RDW: 13.9 % (ref 11.5–15.5)
WBC: 8.8 10*3/uL (ref 4.0–10.5)

## 2013-05-02 MED ORDER — SIMETHICONE 80 MG PO CHEW
80.0000 mg | CHEWABLE_TABLET | Freq: Three times a day (TID) | ORAL | Status: DC
  Administered 2013-05-02 – 2013-05-04 (×7): 80 mg via ORAL
  Filled 2013-05-02 (×8): qty 1

## 2013-05-02 MED ORDER — WITCH HAZEL-GLYCERIN EX PADS: 1.0000 "application " | MEDICATED_PAD | CUTANEOUS | Status: DC | PRN

## 2013-05-02 MED ORDER — LANOLIN HYDROUS EX OINT: 1.0000 "application " | TOPICAL_OINTMENT | CUTANEOUS | Status: DC | PRN

## 2013-05-02 MED ORDER — PRENATAL MULTIVITAMIN CH
1.0000 | ORAL_TABLET | Freq: Every day | ORAL | Status: DC
  Administered 2013-05-02 – 2013-05-04 (×3): 1 via ORAL
  Filled 2013-05-02 (×3): qty 1

## 2013-05-02 MED ORDER — TETANUS-DIPHTH-ACELL PERTUSSIS 5-2.5-18.5 LF-MCG/0.5 IM SUSP: 0.5000 mL | Freq: Once | INTRAMUSCULAR | Status: DC

## 2013-05-02 MED ORDER — SODIUM CHLORIDE 0.9 % IJ SOLN: 3.0000 mL | INTRAMUSCULAR | Status: DC | PRN

## 2013-05-02 MED ORDER — SIMETHICONE 80 MG PO CHEW: 80.0000 mg | CHEWABLE_TABLET | ORAL | Status: DC | PRN

## 2013-05-02 MED ORDER — METOCLOPRAMIDE HCL 5 MG/ML IJ SOLN: 10.0000 mg | Freq: Three times a day (TID) | INTRAMUSCULAR | Status: DC | PRN

## 2013-05-02 MED ORDER — NALBUPHINE HCL 10 MG/ML IJ SOLN
5.0000 mg | INTRAMUSCULAR | Status: DC | PRN
  Administered 2013-05-02: 10 mg via INTRAVENOUS
  Filled 2013-05-02: qty 1

## 2013-05-02 MED ORDER — ONDANSETRON HCL 4 MG/2ML IJ SOLN: 4.0000 mg | INTRAMUSCULAR | Status: DC | PRN

## 2013-05-02 MED ORDER — IBUPROFEN 600 MG PO TABS
600.0000 mg | ORAL_TABLET | Freq: Four times a day (QID) | ORAL | Status: DC
  Administered 2013-05-02 – 2013-05-04 (×9): 600 mg via ORAL
  Filled 2013-05-02 (×9): qty 1

## 2013-05-02 MED ORDER — NALBUPHINE HCL 10 MG/ML IJ SOLN
5.0000 mg | INTRAMUSCULAR | Status: DC | PRN
  Administered 2013-05-02: 10 mg via SUBCUTANEOUS
  Filled 2013-05-02 (×2): qty 1

## 2013-05-02 MED ORDER — ONDANSETRON HCL 4 MG PO TABS
4.0000 mg | ORAL_TABLET | ORAL | Status: DC | PRN
Start: 2013-05-02 — End: 2013-05-04

## 2013-05-02 MED ORDER — ONDANSETRON HCL 4 MG/2ML IJ SOLN: 4.0000 mg | Freq: Three times a day (TID) | INTRAMUSCULAR | Status: DC | PRN

## 2013-05-02 MED ORDER — LACTATED RINGERS IV SOLN
INTRAVENOUS | Status: DC
  Administered 2013-05-02: 04:00:00 via INTRAVENOUS

## 2013-05-02 MED ORDER — OXYCODONE-ACETAMINOPHEN 5-325 MG PO TABS
1.0000 | ORAL_TABLET | ORAL | Status: DC | PRN
  Administered 2013-05-02 – 2013-05-04 (×6): 1 via ORAL
  Filled 2013-05-02 (×6): qty 1

## 2013-05-02 MED ORDER — NALOXONE HCL 0.4 MG/ML IJ SOLN: 0.4000 mg | INTRAMUSCULAR | Status: DC | PRN

## 2013-05-02 MED ORDER — DIPHENHYDRAMINE HCL 25 MG PO CAPS: 25.0000 mg | ORAL_CAPSULE | ORAL | Status: DC | PRN

## 2013-05-02 MED ORDER — DIPHENHYDRAMINE HCL 50 MG/ML IJ SOLN: 12.5000 mg | INTRAMUSCULAR | Status: DC | PRN

## 2013-05-02 MED ORDER — NALOXONE HCL 1 MG/ML IJ SOLN
1.0000 ug/kg/h | INTRAVENOUS | Status: DC | PRN
  Filled 2013-05-02: qty 2

## 2013-05-02 MED ORDER — SENNOSIDES-DOCUSATE SODIUM 8.6-50 MG PO TABS
2.0000 | ORAL_TABLET | ORAL | Status: DC
  Administered 2013-05-02: 2 via ORAL
  Filled 2013-05-02 (×2): qty 2

## 2013-05-02 MED ORDER — OXYTOCIN 40 UNITS IN LACTATED RINGERS INFUSION - SIMPLE MED: 62.5000 mL/h | INTRAVENOUS | Status: AC

## 2013-05-02 MED ORDER — IBUPROFEN 600 MG PO TABS: 600.0000 mg | ORAL_TABLET | Freq: Four times a day (QID) | ORAL | Status: DC | PRN

## 2013-05-02 MED ORDER — DIBUCAINE 1 % RE OINT: 1.0000 "application " | TOPICAL_OINTMENT | RECTAL | Status: DC | PRN

## 2013-05-02 MED ORDER — MENTHOL 3 MG MT LOZG: 1.0000 | LOZENGE | OROMUCOSAL | Status: DC | PRN

## 2013-05-02 MED ORDER — SIMETHICONE 80 MG PO CHEW
80.0000 mg | CHEWABLE_TABLET | ORAL | Status: DC
  Administered 2013-05-02 – 2013-05-03 (×2): 80 mg via ORAL
  Filled 2013-05-02 (×2): qty 1

## 2013-05-02 MED ORDER — FERROUS SULFATE 325 (65 FE) MG PO TABS
325.0000 mg | ORAL_TABLET | Freq: Two times a day (BID) | ORAL | Status: DC
  Administered 2013-05-02 – 2013-05-04 (×4): 325 mg via ORAL
  Filled 2013-05-02 (×4): qty 1

## 2013-05-02 MED ORDER — DIPHENHYDRAMINE HCL 50 MG/ML IJ SOLN: 25.0000 mg | INTRAMUSCULAR | Status: DC | PRN

## 2013-05-02 MED ORDER — ZOLPIDEM TARTRATE 5 MG PO TABS: 5.0000 mg | ORAL_TABLET | Freq: Every evening | ORAL | Status: DC | PRN

## 2013-05-02 MED ORDER — DIPHENHYDRAMINE HCL 25 MG PO CAPS: 25.0000 mg | ORAL_CAPSULE | Freq: Four times a day (QID) | ORAL | Status: DC | PRN

## 2013-05-02 NOTE — Lactation Note (Signed)
This note was copied from the chart of Donna Larita FifeMichelle Lamartina. Lactation Consultation Note  Patient Name: Donna Alvarado [id number]'XToday's Date: 05/02/2013 Reason for consult: Initial assessment   Maternal Data Formula Feeding for Exclusion: Yes Reason for exclusion: Mother's choice to formula and breast feed on admission Infant to breast within first hour of birth: No Breastfeeding delayed due to:: Maternal status Has patient been taught Hand Expression?: Yes Does the patient have breastfeeding experience prior to this delivery?: No  Feeding Feeding Type: Breast Fed Length of feed: 30 min  LATCH Score/Interventions Latch: Grasps breast easily, tongue down, lips flanged, rhythmical sucking. Intervention(s): Skin to skin;Teach feeding cues;Waking techniques Intervention(s): Adjust position;Assist with latch;Breast compression  Audible Swallowing: Spontaneous and intermittent Intervention(s): Skin to skin;Hand expression  Type of Nipple: Flat (firms with stimulation) Intervention(s): Reverse pressure;Double electric pump  Comfort (Breast/Nipple): Filling, red/small blisters or bruises, mild/mod discomfort     Hold (Positioning): Assistance needed to correctly position infant at breast and maintain latch. Intervention(s): Support Pillows;Position options;Skin to skin  LATCH Score: 7  Lactation Tools Discussed/Used     Consult Status Consult Status: Follow-up Date: 05/02/13 Follow-up type: In-patient    Donna Alvarado 05/02/2013, 6:27 PM

## 2013-05-02 NOTE — Progress Notes (Signed)
Subjective: Postpartum Day 1: Ces[facility] Delivery [facility] FTP, [facility] Patient has stood at bedside, no syncope or dizziness.  Foley still in place. Feeding:  Breast Contraceptive plan:  Undecided  Objective: Vital signs in last 24 hours: Temp:  [97.5 F (36.4 C)-98.8 F (37.1 C)] 98.2 F (36.8 C) (04/09 0734) Pulse Rate:  [48-72] 56 (04/09 0734) Resp:  [14-39] 17 (04/09 0734) BP: (91-160)/(55-96) 124/64 mmHg (04/09 0734) SpO2:  [95 %-100 %] 100 % (04/09 0734)  Physical Exam:  General: alert Lochia: appropriate Uterine Fundus: firm Incision: Honeycomb dressing CDI DVT Evaluation: No evidence of DVT seen on physical exam. Negative Homan's sign.    Recent Labs  05/01/13 0140 05/02/13 0550  HGB 10.9* 8.5*  HCT 32.8* 26.2*  WBC 8.1 8.8    Assessment/Plan: Status post Cesarean section day 1 Anemia without hemodynamic instability Doing well postoperatively.  Check orthostatics FeSO4 BID CBC tomorrow Continue current care.     Nigel BridgemanVicki Margrit Minner 05/02/2013, 8:02 AM

## 2013-05-02 NOTE — Addendum Note (Signed)
Addendum created 05/02/13 0759 by Renford DillsJanet L Joanell Cressler, CRNA   Modules edited: Notes Section   Notes Section:  File: [id number]

## 2013-05-02 NOTE — Lactation Note (Signed)
This note was copied from the chart of Donna Larita FifeMichelle Vice. Lactation Consultation. Just fed baby 40 min. Per parents. Stated went well. No bruising noted to nipples. [facility] brochure given w/resources, support groups and LC services.Encouraged to call for assistance if needed and to verify proper latch. Patient Name: Donna Larita FifeMichelle Halberstadt ZOXWR'UToday's Date: 05/02/2013     Maternal Data    Feeding Feeding Type: Breast Fed Length of feed: 0 min  LATCH Score/Interventions Latch: Too sleepy or reluctant, no latch achieved, no sucking elicited. Intervention(s): Skin to skin;Teach feeding cues  Audible Swallowing: None Intervention(s): Skin to skin  Type of Nipple: Everted at rest and after stimulation  Comfort (Breast/Nipple): Soft / non-tender     Hold (Positioning): Assistance needed to correctly position infant at breast and maintain latch.  LATCH Score: 5  Lactation Tools Discussed/Used     Consult Status      Charyl DancerLaura G Nate Perri 05/02/2013, 2:46 AM

## 2013-05-02 NOTE — Anesthesia Postprocedure Evaluation (Signed)
  Anesthesia Post-op Note  Patient: Donna Alvarado  Procedure(s) Performed: Procedure(s): CESAREAN SECTION (N/A)  Patient Location: Mother/Baby  Anesthesia Type:Epidural  Level of Consciousness: awake  Airway and Oxygen Therapy: Patient Spontanous Breathing  Post-op Pain: mild  Post-op Assessment: Post-op Vital signs reviewed, Patient's Cardiovascular Status Stable and Respiratory Function Stable  Post-op Vital Signs: stable  Last Vitals:  Filed Vitals:   05/02/13 0734  BP: 124/64  Pulse: 56  Temp: 36.8 C  Resp: 17    Complications: No apparent anesthesia complications

## 2013-05-03 ENCOUNTER — Encounter (HOSPITAL_COMMUNITY): Payer: Self-pay | Admitting: Obstetrics and Gynecology

## 2013-05-03 LAB — CBC
HCT: 25.7 % — ABNORMAL LOW (ref 36.0–46.0)
HEMOGLOBIN: 8.2 g/dL — AB (ref 12.0–15.0)
MCH: 27.2 pg (ref 26.0–34.0)
MCHC: 31.9 g/dL (ref 30.0–36.0)
MCV: 85.4 fL (ref 78.0–100.0)
PLATELETS: 129 10*3/uL — AB (ref 150–400)
RBC: 3.01 MIL/uL — AB (ref 3.87–5.11)
RDW: 14 % (ref 11.5–15.5)
WBC: 8.1 10*3/uL (ref 4.0–10.5)

## 2013-05-03 NOTE — Clinical Social Work Psychosocial (Unsigned)
     Clinical Social Work Department BRIEF PSYCHOSOCIAL ASSESSMENT 05/03/2013  Patient:  Donna Alvarado,Donna Alvarado     Account Number:  [id number]     Admit date:  05/01/2013  Clinical Social Worker:  Melene PlanSLADE,Ilanna Deihl, LCSW  Date/Time:  05/03/2013 11:26 AM  Referred by:  Physician  Date Referred:  05/03/2013 Referred for  Behavioral Health Issues   Other Referral:   Hx of depression/anxiety   Interview type:  Patient Other interview type:    PSYCHOSOCIAL DATA Living Status:  SIGNIFICANT OTHER Admitted from facility:   Level of care:   Primary support name:  Donna Alvarado Primary support relationship to patient:  PARTNER Degree of support available:   Involved    CURRENT CONCERNS Current Concerns  Behavioral Health Issues   Other Concerns:    SOCIAL WORK ASSESSMENT / PLAN CSW referral received to assess history of depression/anxiety.  Pt is a 28 year old, G1P1 who lives with her fiance.  She is employed at [employer]. She was diagnosed with depression/anxiety last year due to issues with her employer.  Pt sought treatment & was prescribed Depakote & Latuda.  She states the medication was helpful but made her sick.  She also participated in counseling with Dr. Excell Seltzerooper @ [facility] for 4 months.  She continues to see him as needed & plans to schedule an appointment upon discharge.  Pts affect is flat.  She admits to depressed moods at this time.  CSW discussed PP depression signs/symptoms with pt & offered to speak to physician about starting her on an anti-depressant prior to discharge.  Pt was agreeable.  RN notified.  Pt was all the necessary supplies for the infant.  She describes her fiance as supportive.  CSW available to assist further if needed.   Assessment/plan status:  No Further Intervention Required Other assessment/ plan:   Information/referral to community resources:   Follow up with psychiatrist.    PATIENTS/FAMILYS RESPONSE TO PLAN OF CARE: Pt thanked CSW for  consult & was receptive to information discussed.

## 2013-05-03 NOTE — Progress Notes (Signed)
Subjective: Postpartum Day 2: Primary Cesarean Delivery due to FTP, NRFHR Patient up ad lib, reports no syncope or dizziness. Reports flatulence, denies N/V and bm Feeding:  Breast/Bottle Contraceptive plan:  None  Objective: Vital signs in last 24 hours: Temp:  [98 F (36.7 C)-98.1 F (36.7 C)] 98.1 F (36.7 C) (04/10 0705) Pulse Rate:  [62-65] 62 (04/10 0705) Resp:  [18] 18 (04/10 0705) BP: (125-130)/(61-83) 127/83 mmHg (04/10 0705) SpO2:  [98 %-100 %] 98 % (04/09 2140)  Physical Exam:  General: alert, cooperative and no distress Lochia: appropriate Uterine Fundus: firm, U/-2 Abdomen: soft, fundus NT, BSX4Q Incision:Honeycomb dressing, CDI DVT Evaluation: No evidence of DVT seen on physical exam. Negative Homan's sign. Calf/Ankle edema is present. JP drain:   None   Recent Labs  05/01/13 0140 05/02/13 0550 05/03/13 0550  HGB 10.9* 8.5* 8.2*  HCT 32.8* 26.2* 25.7*  WBC 8.1 8.8 8.1    Assessment S/P Primary Cesarean Section day 2 Stable Desires Inpatient Circumcision Asymptomatic Anemia  Plan: Continue current mgmt Infant circ today Plan for d/c tomorrow    Joyice FasterJessica Lynn [facility] 05/03/2013, 3:00 PM

## 2013-05-03 NOTE — Lactation Note (Signed)
This note was copied from the chart of Donna Larita FifeMichelle Qu. Lactation Consultation Note  Patient Name: Donna Larita FifeMichelle Wilber ZOXWR'UToday's Date: 05/03/2013 Reason for consult: Follow-up assessment;Difficult latch Baby under double phototherapy, in crib sucking on pacifier.  Offered help with latching baby to the breast.  Undressed baby to facilitate skin to skin.  Baby opened widely but unable to sustain a deep areolar grasp.  Initiated a 24 mm nipple shield, and baby latched easily, deeply and rhythmically began sucking and swallowing.  Mom very encouraged. Teaching done on positioning and need for regular double pumping to stimulate her milk supply.  Recommended she ask for help as needed.  Encouraged Mom to awaken baby at least every 3 hrs.  To utilize her expressed colostrum at next feeding.  To follow up tomorrow.   Consult Status Consult Status: Follow-up Date: 05/04/13 Follow-up type: In-patient    Judee ClaraCaroline E Braeleigh Pyper 05/03/2013, 12:29 PM

## 2013-05-04 MED ORDER — IBUPROFEN 600 MG PO TABS: 600.0000 mg | ORAL_TABLET | Freq: Four times a day (QID) | ORAL | Status: DC | PRN

## 2013-05-04 MED ORDER — SERTRALINE HCL 50 MG PO TABS: 50.0000 mg | ORAL_TABLET | Freq: Every day | ORAL | Status: DC

## 2013-05-04 MED ORDER — OXYCODONE-ACETAMINOPHEN 5-325 MG PO TABS: 1.0000 | ORAL_TABLET | ORAL | Status: DC | PRN

## 2013-05-04 NOTE — Discharge Summary (Signed)
Cesarean Section Delivery Discharge Summary  Donna FifeMichelle Kalisz  DOB:    09/19/1985 MRN:    [id number] CSN:    [id number]  Date of admission:                  05/01/13  Date of discharge:                   05/04/13  Procedures this admission:  Primary LTCS due to FTP, NRFHR  Date of Delivery: 05/01/13  Newborn Data:  Live born female  Birth Weight: 6 lb 13 oz (3090 g) APGAR: 8, 9  Home with mother. Circumcision Plan: Inpatient  History of Present Illness:  [name] is a 28 y.o. female, G1P1001, who presents at 6447w6d weeks gestation. The patient [facility].    Her pregnancy has been complicated by:  Patient Active Problem List   Diagnosis Date Noted  . Status post primary low transverse cesarean section--FTP, NRFHR 05/02/2013  . 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 Depression 12/14/2012  . Generalized anxiety disorder 12/14/2012  . UTI (urinary tract infection) 12/14/2012  . BV (bacterial vaginosis) 03/11/2011    Hospital Course: Patient was admitted on 05/01/13 for early labor at 3940 6/7 weeks--she had been scheduled for induction that evening.  Cervix was 2 cm. GBS negative.  Epidural was placed, and pitocin augmentation was begun when patient was 5 cm, with inadequate contractions noted.  She began to have variable decelerations that became more persistent and did not resolve with usual interventions, and her cervix had not advanced, and the vtx had not descended beyond -3.  Dr. Su Hiltoberts took the patient to C/S, which was performed under epidural anesthesia.  A viable female was delivered from a direct OP presentation, in good condition, with Apgars and weight listed below.  Patient tolerated the procedure well.  During her postpartum phase, she was working on breastfeeding with Advertising copywriterlactation consultant support.  The baby was placed on the biliblanket on day 3, with decrease in the bili  level on the day of d/c.  Her Hgb on day 1 was 8.5 (pre-delivery was 10.9), and 8.2 on day 2, with no syncope or dizziness. Patient did receive a SW consult for hx of depression, and did request med for pp depression, due to her hx.  On the day of d/c, she was prescribed Zoloft 30 mg daily.  The rest of her post-op/pp course was uncomplicated, and she was d/c'd home on pp day 3.  She declined contraception decision at present.  Feeding:  breast  Contraception:  Declines at present  Discharge hemoglobin:  Hemoglobin  Date Value Ref Range Status  05/03/2013 8.2* 12.0 - 15.0 g/dL Final  2/4/40104/09/2013 8.5* 27.212.0 - 15.0 g/dL Final     DELTA CHECK NOTED     REPEATED TO VERIFY  05/01/2013 10.9* 12.0 - 15.0 g/dL Final  5/36/64402/15/2013 34.713.6  12.2 - 16.2 g/dL Final     HCT  Date Value Ref Range Status  05/03/2013 25.7* 36.0 - 46.0 % Final  05/02/2013 26.2* 36.0 - 46.0 % Final  05/01/2013 32.8* 36.0 - 46.0 % Final     HCT, POC  Date Value Ref Range Status  03/11/2011 44.0  37.7 - 47.9 % Final     WBC  Date Value Ref Range Status  05/03/2013 8.1  4.0 - 10.5 K/uL Final  05/02/2013 8.8  4.0 - 10.5 K/uL Final  05/01/2013 8.1  4.0 - 10.5 K/uL Final  03/11/2011 7.1  4.6 - 10.2 K/uL Final    Discharge Physical Exam:   General: alert Lochia: appropriate Uterine Fundus: firm Incision: Honeycomb dressing CDI DVT Evaluation: No evidence of DVT seen on physical exam. Negative Homan's sign.  Intrapartum Procedures: cesarean: low cervical, transverse--FTP, NRFHR Postpartum Procedures: none Complications-Operative and Postpartum: Anemia without hemodynamic compromise  Discharge Diagnoses: Term Pregnancy-delivered and primary LTCS due to FTP and NRFHR  Discharge Information:  Activity:           pelvic rest Diet:                routine Medications: Ibuprofen, Percocet and Zoloft 50 mg daily Condition:      stable Instructions:  Discharge to: home  Follow-up Information   Follow up with [facility]  Obstetrics & Gynecology. Schedule an appointment as soon as possible for a visit in 6 weeks. (Call for any questions or concerns.)    Specialty:  Obstetrics and Gynecology   Contact information:   [address] [place] [address] [phone]       Nigel Bridgeman 05/04/2013

## 2013-05-04 NOTE — Discharge Instructions (Signed)
Recommend OTC iron supplement daily, with iron rich foods in your diet.  Anemia, Nonspecific Anemia is a condition in which the concentration of red blood cells or hemoglobin in the blood is below normal. Hemoglobin is a substance in red blood cells that carries oxygen to the tissues of the body. Anemia results in not enough oxygen reaching these tissues.  CAUSES  Common causes of anemia include:   Excessive bleeding. Bleeding may be internal or external. This includes excessive bleeding from periods (in women) or from the intestine.   Poor nutrition.   Chronic kidney, thyroid, and liver disease.  Bone marrow disorders that decrease red blood cell production.  Cancer and treatments for cancer.  HIV, AIDS, and their treatments.  Spleen problems that increase red blood cell destruction.  Blood disorders.  Excess destruction of red blood cells due to infection, medicines, and autoimmune disorders. SIGNS AND SYMPTOMS   Minor weakness.   Dizziness.   Headache.  Palpitations.   Shortness of breath, especially with exercise.   Paleness.  Cold sensitivity.  Indigestion.  Nausea.  Difficulty sleeping.  Difficulty concentrating. Symptoms may occur suddenly or they may develop slowly.  DIAGNOSIS  Additional blood tests are often needed. These help your health care provider determine the best treatment. Your health care provider will check your stool for blood and look for other causes of blood loss.  TREATMENT  Treatment varies depending on the cause of the anemia. Treatment can include:   Supplements of iron, vitamin B12, or folic acid.   Hormone medicines.   A blood transfusion. This may be needed if blood loss is severe.   Hospitalization. This may be needed if there is significant continual blood loss.   Dietary changes.  Spleen removal. HOME CARE INSTRUCTIONS Keep all follow-up appointments. It often takes many weeks to correct anemia, and having  your health care provider check on your condition and your response to treatment is very important. SEEK IMMEDIATE MEDICAL CARE IF:   You develop extreme weakness, shortness of breath, or chest pain.   You become dizzy or have trouble concentrating.  You develop heavy vaginal bleeding.   You develop a rash.   You have bloody or black, tarry stools.   You faint.   You vomit up blood.   You vomit repeatedly.   You have abdominal pain.  You have a fever or persistent symptoms for more than 2 3 days.   You have a fever and your symptoms suddenly get worse.   You are dehydrated.  MAKE SURE YOU:  Understand these instructions.  Will watch your condition.  Will get help right away if you are not doing well or get worse. Document Released: 02/18/2004 Document Revised: 09/12/2012 Document Reviewed: 07/06/2012 [facility] Patient Information 2014 [place], [place].   Postpartum Care After Cesarean Delivery After you deliver your newborn (postpartum period), the usual stay in the hospital is 24 72 hours. If there were problems with your labor or delivery, or if you have other medical problems, you might be in the hospital longer.  While you are in the hospital, you will receive help and instructions on how to care for yourself and your newborn during the postpartum period.  While you are in the hospital:  It is normal for you to have pain or discomfort from the incision in your abdomen. Be sure to tell your nurses when you are having pain, where the pain is located, and what makes the pain worse.  If you are breastfeeding,  you may feel uncomfortable contractions of your uterus for a couple of weeks. This is normal. The contractions help your uterus get back to normal size.  It is normal to have some bleeding after delivery.  For the first 1 3 days after delivery, the flow is red and the amount may be similar to a period.  It is common for the flow to start and stop.  In  the first few days, you may pass some small clots. Let your nurses know if you begin to pass large clots or your flow increases.  Do not  flush blood clots down the toilet before having the nurse look at them.  During the next 3 10 days after delivery, your flow should become more watery and pink or brown-tinged in color.  Ten to fourteen days after delivery, your flow should be a small amount of yellowish-white discharge.  The amount of your flow will decrease over the first few weeks after delivery. Your flow may stop in 6 8 weeks. Most women have had their flow stop by 12 weeks after delivery.  You should change your sanitary pads frequently.  Wash your hands thoroughly with soap and water for at least 20 seconds after changing pads, using the toilet, or before holding or feeding your newborn.  Your intravenous (IV) tubing will be removed when you are drinking enough fluids.  The urine drainage tube (urinary catheter) that was inserted before delivery may be removed within 6 8 hours after delivery or when feeling returns to your legs. You should feel like you need to empty your bladder within the first 6 8 hours after the catheter has been removed.  In case you become weak, lightheaded, or faint, call your nurse before you get out of bed for the first time and before you take a shower for the first time.  Within the first few days after delivery, your breasts may begin to feel tender and full. This is called engorgement. Breast tenderness usually goes away within 48 72 hours after engorgement occurs. You may also notice milk leaking from your breasts. If you are not breastfeeding, do not stimulate your breasts. Breast stimulation can make your breasts produce more milk.  Spending as much time as possible with your newborn is very important. During this time, you and your newborn can feel close and get to know each other. Having your newborn stay in your room (rooming in) will help to  strengthen the bond with your newborn. It will give you time to get to know your newborn and become comfortable caring for your newborn.  Your hormones change after delivery. Sometimes the hormone changes can temporarily cause you to feel sad or tearful. These feelings should not last more than a few days. If these feelings last longer than that, you should talk to your caregiver.  If desired, talk to your caregiver about methods of family planning or contraception.  Talk to your caregiver about immunizations. Your caregiver may want you to have the following immunizations before leaving the hospital:  Tetanus, diphtheria, and pertussis (Tdap) or tetanus and diphtheria (Td) immunization. It is very important that you and your family (including grandparents) or others caring for your newborn are up-to-date with the Tdap or Td immunizations. The Tdap or Td immunization can help protect your newborn from getting ill.  Rubella immunization.  Varicella (chickenpox) immunization.  Influenza immunization. You should receive this annual immunization if you did not receive the immunization during your pregnancy. Document Released:  10/05/2011 Document Reviewed: 10/05/2011 [employer] Patient Information 2014 [place].

## 2013-05-06 ENCOUNTER — Inpatient Hospital Stay (HOSPITAL_COMMUNITY)
Admission: AD | Admit: 2013-05-06 | Discharge: 2013-05-08 | DRG: 776 | Disposition: A | Discharge status: Home / Self Care | Payer: BC Managed Care – PPO | Source: Ambulatory Visit | Attending: Obstetrics and Gynecology | Admitting: Obstetrics and Gynecology

## 2013-05-06 ENCOUNTER — Encounter (HOSPITAL_COMMUNITY): Payer: Self-pay | Admitting: *Deleted

## 2013-05-06 DIAGNOSIS — O1405 Mild to moderate pre-eclampsia, complicating the puerperium: Secondary | ICD-10-CM | POA: Diagnosis present

## 2013-05-06 DIAGNOSIS — O9279 Other disorders of lactation: Secondary | ICD-10-CM | POA: Diagnosis present

## 2013-05-06 DIAGNOSIS — O99345 Other mental disorders complicating the puerperium: Principal | ICD-10-CM | POA: Diagnosis present

## 2013-05-06 DIAGNOSIS — F3289 Other specified depressive episodes: Secondary | ICD-10-CM | POA: Diagnosis present

## 2013-05-06 DIAGNOSIS — F329 Major depressive disorder, single episode, unspecified: Secondary | ICD-10-CM | POA: Diagnosis present

## 2013-05-06 DIAGNOSIS — O1490 Unspecified pre-eclampsia, unspecified trimester: Secondary | ICD-10-CM | POA: Diagnosis present

## 2013-05-06 DIAGNOSIS — D649 Anemia, unspecified: Secondary | ICD-10-CM | POA: Diagnosis present

## 2013-05-06 DIAGNOSIS — Z87891 Personal history of nicotine dependence: Secondary | ICD-10-CM

## 2013-05-06 DIAGNOSIS — IMO0002 Reserved for concepts with insufficient information to code with codable children: Secondary | ICD-10-CM | POA: Diagnosis present

## 2013-05-06 LAB — COMPREHENSIVE METABOLIC PANEL
ALK PHOS: 111 U/L (ref 39–117)
ALT: 24 U/L (ref 0–35)
AST: 25 U/L (ref 0–37)
Albumin: 2.3 g/dL — ABNORMAL LOW (ref 3.5–5.2)
BUN: 11 mg/dL (ref 6–23)
CO2: 24 meq/L (ref 19–32)
CREATININE: 0.66 mg/dL (ref 0.50–1.10)
Calcium: 8.5 mg/dL (ref 8.4–10.5)
Chloride: 105 mEq/L (ref 96–112)
GFR calc Af Amer: >90 mL/min (ref >90)
Glucose, Bld: 85 mg/dL (ref 70–99)
POTASSIUM: 3.9 meq/L (ref 3.7–5.3)
Sodium: 140 mEq/L (ref 137–147)
Total Bilirubin: 0.3 mg/dL (ref 0.3–1.2)
Total Protein: 6.2 g/dL (ref 6.0–8.3)

## 2013-05-06 LAB — URIC ACID: Uric Acid, Serum: 5.3 mg/dL (ref 2.4–7.0)

## 2013-05-06 LAB — CBC
HEMATOCRIT: 26.8 % — AB (ref 36.0–46.0)
Hemoglobin: 8.8 g/dL — ABNORMAL LOW (ref 12.0–15.0)
MCH: 27.8 pg (ref 26.0–34.0)
MCHC: 32.8 g/dL (ref 30.0–36.0)
MCV: 84.8 fL (ref 78.0–100.0)
Platelets: 185 10*3/uL (ref 150–400)
RBC: 3.16 MIL/uL — AB (ref 3.87–5.11)
RDW: 14.1 % (ref 11.5–15.5)
WBC: 6.5 10*3/uL (ref 4.0–10.5)

## 2013-05-06 LAB — PROTEIN / CREATININE RATIO, URINE
Creatinine, Urine: 287.18 mg/dL
Protein Creatinine Ratio: 0.29 — ABNORMAL HIGH (ref 0.00–0.15)
TOTAL PROTEIN, URINE: 82.9 mg/dL

## 2013-05-06 LAB — LIPASE, BLOOD: LIPASE: 20 U/L (ref 11–59)

## 2013-05-06 LAB — MRSA PCR SCREENING: MRSA by PCR: NEGATIVE

## 2013-05-06 LAB — LACTATE DEHYDROGENASE: LDH: 279 U/L — ABNORMAL HIGH (ref 94–250)

## 2013-05-06 LAB — AMYLASE: Amylase: 45 U/L (ref 0–105)

## 2013-05-06 MED ORDER — MAGNESIUM SULFATE 40 G IN LACTATED RINGERS - SIMPLE
2.0000 g/h | INTRAVENOUS | Status: AC
  Administered 2013-05-06: 2 g/h via INTRAVENOUS
  Filled 2013-05-06 (×2): qty 500

## 2013-05-06 MED ORDER — HYDRALAZINE HCL 20 MG/ML IJ SOLN
5.0000 mg | INTRAMUSCULAR | Status: DC | PRN
  Administered 2013-05-07: 5 mg via INTRAVENOUS
  Filled 2013-05-06: qty 1

## 2013-05-06 MED ORDER — PRENATAL MULTIVITAMIN CH
1.0000 | ORAL_TABLET | Freq: Every day | ORAL | Status: DC
  Administered 2013-05-07: 1 via ORAL
  Filled 2013-05-06: qty 1

## 2013-05-06 MED ORDER — LACTATED RINGERS IV SOLN
INTRAVENOUS | Status: DC
  Administered 2013-05-07: 14:00:00 via INTRAVENOUS

## 2013-05-06 MED ORDER — MAGNESIUM SULFATE BOLUS VIA INFUSION
4.0000 g | Freq: Once | INTRAVENOUS | Status: AC
  Administered 2013-05-06: 4 g via INTRAVENOUS
  Filled 2013-05-06: qty 500

## 2013-05-06 MED ORDER — ZOLPIDEM TARTRATE 5 MG PO TABS: 5.0000 mg | ORAL_TABLET | Freq: Every evening | ORAL | Status: DC | PRN

## 2013-05-06 MED ORDER — IBUPROFEN 600 MG PO TABS
600.0000 mg | ORAL_TABLET | Freq: Four times a day (QID) | ORAL | Status: DC | PRN
  Administered 2013-05-07 – 2013-05-08 (×2): 600 mg via ORAL
  Filled 2013-05-06 (×2): qty 1

## 2013-05-06 MED ORDER — SERTRALINE HCL 50 MG PO TABS
50.0000 mg | ORAL_TABLET | Freq: Every day | ORAL | Status: DC
  Filled 2013-05-06 (×2): qty 1

## 2013-05-06 MED ORDER — OXYCODONE-ACETAMINOPHEN 5-325 MG PO TABS
1.0000 | ORAL_TABLET | ORAL | Status: DC | PRN
  Administered 2013-05-06 – 2013-05-08 (×4): 1 via ORAL
  Filled 2013-05-06 (×5): qty 1

## 2013-05-06 NOTE — Progress Notes (Signed)
Anxious, awaiting pain med and Magnesium start

## 2013-05-06 NOTE — MAU Note (Addendum)
Abd dsg on and clean and dry. Nigel BridgemanVicki Latham CNM in to see pt. CNM asked for IV not to be started until after lab results back which would determine further plan of care. Pt aware.

## 2013-05-06 NOTE — H&P (Signed)
Donna Alvarado is a 28 y.o. female, G1P1 s/p primary LTCS on 05/01/13 for arrest of dilation and fetal intolerance to labor, presenting c/o mild HA, visual spots, and swelling.  Patient also noting some increase in depression and "feeling overwhelmed".  She is   Patient Active Problem List   Diagnosis Date Noted  . Mild or unspecified pre-eclampsia, with delivery 05/06/2013  . Anemia 05/06/2013  . Status post primary low transverse cesarean section--FTP, NRFHR 05/02/2013  . 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 Depression 12/14/2012  . Generalized anxiety disorder 12/14/2012    Pregnancy Hx: Patient entered care at 10 weeks.  EDC of 04/25/13 was established by LMP.  Anatomy scan: 19 weeks, limited with normal findings and an anterior placenta.  Additional [place] evaluations: 22 weeks - anatomy scan completed .  Significant prenatal events: none  Admitted for labor on 05/01/13, with cervix 2 cm.  Received IV pain med and then epidural.  AROM was performed at 4 cm, with clear fluid noted.  Pitocin augmentation was begun for inadequate contractions at 5 cm.  FHR decelerations began to occur, with no cervical change noted.  Patient was consented for C/S by Dr. Su Hilt.  Patient had primary LTCS under epidural analgesia on 05/01/13, with fetus noted to be direct OP with nuchal cord.  The patient tolerated the procedure well, and infant remained with her in OR and PACU.  She had an uncomplicated postpartum course, but did admit to some worsening depression (had prior hx).  Was Rx'd with Zoloft 50 mg on the day of d/c, but she advises she has not taken the medication yet--"was taking other meds and wanted to see how I did".  She was d/c'd home on 4/10 in stable condition.  She did have a single elevated BP prior to d/c.  [facility] visit was requested for today, but they did not see patient today.  She called the office reporting not feeling well, with swelling and mild blurry vision.  Patient had SW consult prior to  d/c due to hx of depression.    OB History   Grav Para Term Preterm Abortions TAB SAB Ect Mult Living   1 1 1  0 0 0 0 0  1     Past Medical History  Diagnosis Date  . Endometriosis   . Chest pain, atypical     per pt ? from Ultram- upper right chest pain  . Headache(784.0)   . Anxiety   . Depression     no meds  . Shortness of breath    Past Surgical History  Procedure Laterality Date  . Laparoscopy  11/03/2010    Procedure: LAPAROSCOPY DIAGNOSTIC;  Surgeon: Loney Laurence;  Location: [facility]S;  Service: Gynecology;;  Chromopertubation  . Cesarean section N/A 05/01/2013    Procedure: CESAREAN SECTION;  Surgeon: Purcell Nails, MD;  Location: [facility] ORS;  Service: Obstetrics;  Laterality: N/A;   Family History: family history is negative for Alcohol abuse, Arthritis, Asthma, Cancer, Birth defects, COPD, Depression, Diabetes, Drug abuse, Early death, Hearing loss, Heart disease, Hyperlipidemia, Hypertension, Kidney disease, Learning disabilities, Mental illness, Mental retardation, Miscarriages / Stillbirths, Stroke, Vision loss, and Varicose Veins.  Social History:  reports that she quit smoking about 10 months ago. Her smoking use included Cigarettes. She smoked 0.00 packs per day. She has never used smokeless tobacco. She reports that she does not drink alcohol or use illicit drugs.  FOB is involved and supportive.   ROS:  Pedal edema, mild  HA, sporadic visual spots, incisional pain, minimal lochia  No Known Allergies     Blood pressure 177/92, pulse 49, temperature 98 F (36.7 C), temperature source Oral, unknown if currently breastfeeding.  Filed Vitals:   05/06/13 1925 05/06/13 1939 05/06/13 1947 05/06/13 2002  BP: 176/97 146/81 168/96 177/92  Pulse: 56 51 49 49  Temp:      TempSrc:       BP range in MAU 146-177/81-97 Pulse 49-56 (consistent with pre-delivery values)  Chest clear Heart RRR without murmur, pulse 55. Abd soft, mild incisional tenderness.   Honeycomb dressing CDI Fundus firm, NT. Pelvic: Deferred Ext: 2+ pedal edema, DTR 2-3+, 1 beat clonus bilaterally  Prenatal labs:  ABO, Rh: --/--/O POS, O POS (09/03 1900)  Antibody: NEG (09/03 1900)  Rubella: Immune  RPR: Nonreactive (12/31 0000)  HBsAg: Negative (11/12 0000)  HIV: Non-reactive (11/12 0000)  GBS: Negative (04/01 0000)  Sickle cell/Hgb electrophoresis: n/a  Pap: 10/22/12 ASCUS  GC: Neg  Chlamydia: Neg  Genetic screenings: 1st trimester and AFP normal  Glucola: 97   Results for orders placed during the hospital encounter of 05/06/13 (from the past 24 hour(s))  CBC     Status: Abnormal   Collection Time    05/06/13  7:05 PM      Result Value Ref Range   WBC 6.5  4.0 - 10.5 K/uL   RBC 3.16 (*) 3.87 - 5.11 MIL/uL   Hemoglobin 8.8 (*) 12.0 - 15.0 g/dL   HCT 16.126.8 (*) 09.636.0 - 04.546.0 %   MCV 84.8  78.0 - 100.0 fL   MCH 27.8  26.0 - 34.0 pg   MCHC 32.8  30.0 - 36.0 g/dL   RDW 40.914.1  81.111.5 - 91.415.5 %   Platelets 185  150 - 400 K/uL  COMPREHENSIVE METABOLIC PANEL     Status: Abnormal   Collection Time    05/06/13  7:05 PM      Result Value Ref Range   Sodium 140  137 - 147 mEq/L   Potassium 3.9  3.7 - 5.3 mEq/L   Chloride 105  96 - 112 mEq/L   CO2 24  19 - 32 mEq/L   Glucose, Bld 85  70 - 99 mg/dL   BUN 11  6 - 23 mg/dL   Creatinine, Ser 7.820.66  0.50 - 1.10 mg/dL   Calcium 8.5  8.4 - 95.610.5 mg/dL   Total Protein 6.2  6.0 - 8.3 g/dL   Albumin 2.3 (*) 3.5 - 5.2 g/dL   AST 25  0 - 37 U/L   ALT 24  0 - 35 U/L   Alkaline Phosphatase 111  39 - 117 U/L   Total Bilirubin 0.3  0.3 - 1.2 mg/dL   GFR calc non Af Amer >90  >90 mL/min   GFR calc Af Amer >90  >90 mL/min  LACTATE DEHYDROGENASE     Status: Abnormal   Collection Time    05/06/13  7:05 PM      Result Value Ref Range   LDH 279 (*) 94 - 250 U/L  URIC ACID     Status: None   Collection Time    05/06/13  7:05 PM      Result Value Ref Range   Uric Acid, Serum 5.3  2.4 - 7.0 mg/dL  AMYLASE     Status: None    Collection Time    05/06/13  7:05 PM      Result Value Ref Range  Amylase 45  0 - 105 U/L  LIPASE, BLOOD     Status: None   Collection Time    05/06/13  7:05 PM      Result Value Ref Range   Lipase 20  11 - 59 U/L  PROTEIN / CREATININE RATIO, URINE     Status: Abnormal   Collection Time    05/06/13  7:30 PM      Result Value Ref Range   Creatinine, Urine 287.18     Total Protein, Urine 82.9     PROTEIN CREATININE RATIO 0.29 (*) 0.00 - 0.15     Assessment/Plan: S/P primary LTCS on 4/8 Elevated BP, pedal edema, borderline PCR--probable mild pre-eclampsia Depression  Plan: Admit to AICU per consult with Dr. Cecilio AsperVarnado--may consider less than 24 hours of magnesium, depending on BP trend and labs.  Magnesium sulfate 4 gm bolus, then 2 gm/hr Apresoline IV prn BP parameters (systolic > 160 or diastolic > 161105) Repeat PIH labs in am SW consult in am as f/u. Zoloft 50 mg q day. Breast pump--patient elects to not have infant stay with her while hospitalized.  Shelly BombardVicki LathamCNM, [place] 05/06/2013, 9:01 PM

## 2013-05-06 NOTE — MAU Note (Signed)
Patient states she had a primary cesarean section on 4-8. States her blood pressure was up a little after delivery. States she had had increasing swelling in both feet and legs. Has periods of blurred vision.

## 2013-05-07 DIAGNOSIS — O1490 Unspecified pre-eclampsia, unspecified trimester: Secondary | ICD-10-CM | POA: Diagnosis present

## 2013-05-07 LAB — COMPREHENSIVE METABOLIC PANEL WITH GFR
ALT: 22 U/L (ref 0–35)
AST: 21 U/L (ref 0–37)
Albumin: 2.1 g/dL — ABNORMAL LOW (ref 3.5–5.2)
Alkaline Phosphatase: 113 U/L (ref 39–117)
BUN: 7 mg/dL (ref 6–23)
CO2: 25 meq/L (ref 19–32)
Calcium: 7.7 mg/dL — ABNORMAL LOW (ref 8.4–10.5)
Chloride: 105 meq/L (ref 96–112)
Creatinine, Ser: 0.59 mg/dL (ref 0.50–1.10)
GFR calc Af Amer: >90 mL/min
GFR calc non Af Amer: >90 mL/min
Glucose, Bld: 78 mg/dL (ref 70–99)
Potassium: 3.7 meq/L (ref 3.7–5.3)
Sodium: 141 meq/L (ref 137–147)
Total Bilirubin: <0.2 mg/dL — ABNORMAL LOW (ref 0.3–1.2)
Total Protein: 5.5 g/dL — ABNORMAL LOW (ref 6.0–8.3)

## 2013-05-07 LAB — LACTATE DEHYDROGENASE: LDH: 274 U/L — ABNORMAL HIGH (ref 94–250)

## 2013-05-07 LAB — CBC
HCT: 26.6 % — ABNORMAL LOW (ref 36.0–46.0)
Hemoglobin: 8.5 g/dL — ABNORMAL LOW (ref 12.0–15.0)
MCH: 27 pg (ref 26.0–34.0)
MCHC: 32 g/dL (ref 30.0–36.0)
MCV: 84.4 fL (ref 78.0–100.0)
PLATELETS: 200 10*3/uL (ref 150–400)
RBC: 3.15 MIL/uL — ABNORMAL LOW (ref 3.87–5.11)
RDW: 14.2 % (ref 11.5–15.5)
WBC: 5.9 10*3/uL (ref 4.0–10.5)

## 2013-05-07 LAB — URIC ACID: Uric Acid, Serum: 5.1 mg/dL (ref 2.4–7.0)

## 2013-05-07 MED ORDER — ONDANSETRON HCL 4 MG/2ML IJ SOLN
4.0000 mg | Freq: Four times a day (QID) | INTRAMUSCULAR | Status: DC
  Administered 2013-05-07: 4 mg via INTRAVENOUS
  Filled 2013-05-07: qty 2

## 2013-05-07 MED ORDER — NIFEDIPINE ER 30 MG PO TB24
30.0000 mg | ORAL_TABLET | Freq: Once | ORAL | Status: AC
  Administered 2013-05-07: 30 mg via ORAL
  Filled 2013-05-07: qty 1

## 2013-05-07 MED ORDER — ONDANSETRON HCL 4 MG/2ML IJ SOLN: 4.0000 mg | Freq: Four times a day (QID) | INTRAMUSCULAR | Status: DC | PRN

## 2013-05-07 NOTE — Progress Notes (Signed)
Subjective: Postpartum Day 6: Cesarean Delivery readmitted for PP Pre-eclampsia now on MgSO4 at 2g/hr Denies PIH symptoms Patient reports that pain is well-managed.Lochia normal.  Ambulating, voiding, tolerating diet as ordered without difficulty. Normal flatus. Absent bowel movement.  Objective: Vital signs in last 24 hours: Temp:  [98 F (36.7 C)-98.3 F (36.8 C)] 98 F (36.7 C) (04/14 0812) Pulse Rate:  [49-66] 66 (04/14 0812) Resp:  [16-18] 18 (04/14 0812) BP: (136-177)/(68-99) 146/89 mmHg (04/14 1100) SpO2:  [90 %-99 %] 95 % (04/14 0812) Weight:  [173 lb 4.8 oz (78.608 kg)-174 lb 14.4 oz (79.334 kg)] 173 lb 4.8 oz (78.608 kg) (04/14 0558)  Physical Exam:  General: alert Lochia: appropriate Lungs: clear Heart: normal Uterine Fundus: firm and appropriately tender Incision: healing well DVT Evaluation: No evidence of DVT seen on physical exam. Edema 2+ pitting. DTR 3/4 with 1 beat of clonus   Recent Labs with normal PIH labs  05/06/13 1905 05/07/13 0525  HGB 8.8* 8.5*  HCT 26.8* 26.6*   I/O: -1300 since 7:00 am with hourly diuresis at 325-450 cc/hour  Assessment/Plan: Status post Cesarean section with PP Pre-eclampsia doing well on MgSO4 Persistent HTN: will start Procardia XL 30 mg daily Anticipate discharge tomorrow Plan of care reviewed and questions answered  Donna Alvarado 05/07/2013, 11:27 AM

## 2013-05-07 NOTE — Consult Note (Signed)
Assisted mom with pumping.  Breasts massaged well prior to and during pumping and mom obtained 3 ounces total.  Breasts softened well.  Instructed mom to pump again in 2 hours then got to a every 3 hour schedule.

## 2013-05-07 NOTE — Consult Note (Signed)
Lactation Note:  Mom was readmitted last night to AICU for treatment of pre-eclampsia.  She states her milk came in 3 days ago and she was both putting baby to breast and giving formula.  C/o engorgement since milk came in and she has been alternating ice and heat with not much improvement.  Pump was set up and initiated last evening and she has pumped twice.  The last pumping was 6 hours ago.  Discussed supply and demand with mom and the importance of pumping every 3 hours when baby not here to breastfeed.  Mom states breasts are uncomfortable.  On exam breasts are full with some palpable ducts but not engorged.  Warm compresses applied to breasts and massaged well prior to pumping.  Assisted mom with pumping and massaged breasts intermittently.  Mom pumped a total of 2 ounces and breasts softer and more comfortable.  Instructed to pump every 2 hours x 3 then go to every 3 hours.  I will assist mom with next pumping.

## 2013-05-07 NOTE — Progress Notes (Signed)
Called to check on patient.  AICU RN reports patient had BM and feels much better. Magnesium started at 2247. BP 151/86 post bolus.  Will continue current care.  Nigel BridgemanVicki Amelia Macken, CNM 05/07/13 [phone]

## 2013-05-07 NOTE — Progress Notes (Signed)
UR chart review completed.  

## 2013-05-08 MED ORDER — PNEUMOCOCCAL VAC POLYVALENT 25 MCG/0.5ML IJ INJ
0.5000 mL | INJECTION | INTRAMUSCULAR | Status: DC
Start: 2013-05-09 — End: 2013-05-08

## 2013-05-08 MED ORDER — NIFEDIPINE ER OSMOTIC RELEASE 30 MG PO TB24: 30.0000 mg | ORAL_TABLET | Freq: Every day | ORAL | Status: DC

## 2013-05-08 NOTE — Consult Note (Signed)
Lactation Note: Mom is pumping every 3 hours for 15-20 minutes and obtaining 30-50 mls.  Assisted with pumping this AM.  Breasts are soft this AM.  Encouraged to put baby to breast with feeding cues [facility] discharge [facility] concerns or need for an outpatient appointment.

## 2013-05-08 NOTE — Discharge Instructions (Signed)
Preeclampsia and Eclampsia Preeclampsia is a condition of high blood pressure during pregnancy. It can happen at 20 weeks or later in pregnancy. If high blood pressure occurs in the second half of pregnancy with no other symptoms, it is called gestational hypertension and goes away after the baby is born. If any of the symptoms listed below develop with gestational hypertension, it is then called preeclampsia. Eclampsia (convulsions) may follow preeclampsia.   CAUSES  There is no known cause of preeclampsia/eclampsia in pregnancy. There are several known conditions that may put the pregnant woman at risk, such as:  The first pregnancy.  Having preeclampsia in a past pregnancy.  Having lasting (chronic) high blood pressure.  Having multiples (twins, triplets).  Being age 29 or older.  African American ethnic background.  Having kidney disease or diabetes.  Medical conditions such as lupus or blood diseases.  Being overweight (obese).  SYMPTOMS   High blood pressure.  Headaches.  Sudden weight gain.  Swelling of hands, face, legs, and feet.  Protein in the urine.  Feeling sick to your stomach (nauseous) and throwing up (vomiting).  Vision problems (blurred or double vision).  Numbness in the face, arms, legs, and feet.  Dizziness.  Slurred speech.  Preeclampsia can cause growth retardation in the fetus.  Sensitivity to bright lights. Marland Kitchen. DIAGNOSIS  If protein is found in the urine in the second half of pregnancy, this is considered preeclampsia. Other symptoms mentioned above may also be present.  TREATMENT  It is necessary to treat this.  Your caregiver may prescribe bed rest early in this condition. Plenty of rest and salt restriction may be all that is needed.  Medicines may be necessary to lower blood pressure if the condition does not respond to more conservative measures.  In more severe cases, hospitalization may be needed:  For treatment of blood  pressure.  To control fluid retention.   Preeclampsia and eclampsia involve risks to mother and infant. Your caregiver will discuss these risks with you. Together, you can work out the best possible approach to your problems. Not keeping appointments could result in a chronic or permanent injury, pain, disability to you, and death or injury to you or your unborn baby. If there is any problem keeping the appointment, you must call to reschedule.  HOME CARE INSTRUCTIONS   Get plenty of rest and sleep.  Eat a balanced diet that is low in salt, and do not add salt to your food.  Avoid stressful situations.  Only take over-the-counter and prescriptions medicines for pain, discomfort, or fever as directed by your caregiver. SEEK IMMEDIATE MEDICAL CARE IF:   You develop severe swelling anywhere in the body. This usually occurs in the legs.  You develop a severe headache, dizziness, problems with your vision, or confusion.  You have abdominal pain, nausea, or vomiting.  You have a seizure.  You have trouble moving any part of your body, or you develop numbness or problems speaking.  You have bruising or abnormal bleeding from anywhere in the body.  You develop a stiff neck.  You pass out. MAKE SURE YOU:   Understand these instructions.  Will watch your condition.  Will get help right away if you are not doing well or get worse.   Follow-up appointment: Tuesday 05/14/13 at 10:45 am with [facility]Vicki Latham [facility]   Document Released: 01/08/2000 Document Revised: 04/04/2011 Document Reviewed: 08/24/2007 [facility] Patient Information 2014 [place], [place].

## 2013-05-08 NOTE — Discharge Summary (Signed)
  Obstetric Discharge Summary  Reason for Admission: readmitted POD #5 for post-partum pre-eclampsia on 05/06/13 Postpartum Procedures: Magnesium sulfate therapy for 24 hours and initiation of Procardia XL 30 mg daily Complications-Operative and Postpartum: none  Hemoglobin  Date Value Ref Range Status  05/07/2013 8.5* 12.0 - 15.0 g/dL Final  1/61/09602/15/2013 45.413.6  12.2 - 16.2 g/dL Final     HCT  Date Value Ref Range Status  05/07/2013 26.6* 36.0 - 46.0 % Final     HCT, POC  Date Value Ref Range Status  03/11/2011 44.0  37.7 - 47.9 % Final    Discharge Diagnoses: Post-partum preelampsia    Date: 05/08/2013 Activity: unrestricted Diet: routine Medications: Procardia XL 30 mg daily Condition: stable  Breastfeeding:   yes  Instructions: pre-eclampsia warning signs reviewed Discharge to: home   Follow-up: Tuesday 05/14/13 at 10:45 with Nigel BridgemanVicki Latham CNM for BP check  Crist FatSandra A Finbar Nippert MD 05/08/2013, 11:44 AM

## 2013-05-08 NOTE — Progress Notes (Signed)
CSW met with pt briefly to assess for possible PP depression.  This Probation officer met with this pt last week & recommended an anti-depressant after pt was tearful during assessment.  Pt seemed to be in better spirits today.  She told CSW that she "didn't like the way my body looked," & just upset about the delivery/care she received.  She has the prescription for Zoloft that she states she plans to start once she stops taking other medications.  She reports that she feels better today.  FOB was at the bedside & appears supportive.  Pts mother is taking care of the baby & was identified as good support person as well.  CSW encouraged pt to follow up with her medical provider if PP depression symptoms arise.  Pt smiled & thanked CSW for coming to visit.

## 2013-11-25 ENCOUNTER — Encounter (HOSPITAL_COMMUNITY): Payer: Self-pay | Admitting: *Deleted

## 2013-11-29 ENCOUNTER — Other Ambulatory Visit: Payer: Self-pay

## 2013-12-02 LAB — CYTOLOGY - PAP

## 2013-12-20 ENCOUNTER — Inpatient Hospital Stay (HOSPITAL_COMMUNITY)
Admission: AD | Admit: 2013-12-20 | Discharge: 2013-12-21 | Disposition: A | Discharge status: Home / Self Care | Payer: BC Managed Care – PPO | Source: Ambulatory Visit | Attending: Obstetrics and Gynecology | Admitting: Obstetrics and Gynecology

## 2013-12-20 ENCOUNTER — Inpatient Hospital Stay (HOSPITAL_COMMUNITY): Payer: BC Managed Care – PPO

## 2013-12-20 ENCOUNTER — Encounter (HOSPITAL_COMMUNITY): Payer: Self-pay | Admitting: *Deleted

## 2013-12-20 DIAGNOSIS — Z3A09 9 weeks gestation of pregnancy: Secondary | ICD-10-CM | POA: Diagnosis not present

## 2013-12-20 DIAGNOSIS — Z87891 Personal history of nicotine dependence: Secondary | ICD-10-CM | POA: Insufficient documentation

## 2013-12-20 DIAGNOSIS — O209 Hemorrhage in early pregnancy, unspecified: Secondary | ICD-10-CM | POA: Diagnosis present

## 2013-12-20 DIAGNOSIS — O034 Incomplete spontaneous abortion without complication: Secondary | ICD-10-CM

## 2013-12-20 LAB — CBC
HEMATOCRIT: 36.2 % (ref 36.0–46.0)
Hemoglobin: 12.4 g/dL (ref 12.0–15.0)
MCH: 29.9 pg (ref 26.0–34.0)
MCHC: 34.3 g/dL (ref 30.0–36.0)
MCV: 87.2 fL (ref 78.0–100.0)
PLATELETS: 216 10*3/uL (ref 150–400)
RBC: 4.15 MIL/uL (ref 3.87–5.11)
RDW: 14.8 % (ref 11.5–15.5)
WBC: 13.4 10*3/uL — AB (ref 4.0–10.5)

## 2013-12-20 LAB — URINALYSIS, ROUTINE W REFLEX MICROSCOPIC
Bilirubin Urine: NEGATIVE
Glucose, UA: NEGATIVE mg/dL
KETONES UR: NEGATIVE mg/dL
Leukocytes, UA: NEGATIVE
Nitrite: NEGATIVE
Protein, ur: NEGATIVE mg/dL
UROBILINOGEN UA: 0.2 mg/dL (ref 0.0–1.0)
pH: 6 (ref 5.0–8.0)

## 2013-12-20 LAB — URINE MICROSCOPIC-ADD ON

## 2013-12-20 LAB — POCT PREGNANCY, URINE: Preg Test, Ur: POSITIVE — AB

## 2013-12-20 LAB — HCG, QUANTITATIVE, PREGNANCY: HCG, BETA CHAIN, QUANT, S: 5733 m[IU]/mL — AB (ref <5)

## 2013-12-20 MED ORDER — OXYCODONE-ACETAMINOPHEN 5-325 MG PO TABS
2.0000 | ORAL_TABLET | Freq: Once | ORAL | Status: AC
  Administered 2013-12-21: 2 via ORAL
  Filled 2013-12-20: qty 2

## 2013-12-20 NOTE — MAU Note (Signed)
Bleeding and cramping since yesterday. Stronger stabbing pain since 1500. Small clots at first and then 4 larger clots.

## 2013-12-21 MED ORDER — HYDROCODONE-ACETAMINOPHEN 5-325 MG PO TABS: 1.0000 | ORAL_TABLET | Freq: Four times a day (QID) | ORAL | Status: DC | PRN

## 2013-12-21 NOTE — Discharge Instructions (Signed)
Miscarriage °A miscarriage is the loss of an unborn baby (fetus) before the 20th week of pregnancy. The cause is often unknown.  °HOME CARE °· You may need to stay in bed (bed rest), or you may be able to do light activity. Go about activity as told by your doctor. °· Have help at home. °· Write down how many pads you use each day. Write down how soaked they are. °· Do not use tampons. Do not wash out your vagina (douche) or have sex (intercourse) until your doctor approves. °· Only take medicine as told by your doctor. °· Do not take aspirin. °· Keep all doctor visits as told. °· If you or your partner have problems with grieving, talk to your doctor. You can also try counseling. Give yourself time to grieve before trying to get pregnant again. °GET HELP RIGHT AWAY IF: °· You have bad cramps or pain in your back or belly (abdomen). °· You have a fever. °· You pass large clumps of blood (clots) from your vagina that are walnut-sized or larger. Save the clumps for your doctor to see. °· You pass large amounts of tissue from your vagina. Save the tissue for your doctor to see. °· You have more bleeding. °· You have thick, bad-smelling fluid (discharge) coming from the vagina. °· You get lightheaded, weak, or you pass out (faint). °· You have chills. °MAKE SURE YOU: °· Understand these instructions. °· Will watch your condition. °· Will get help right away if you are not doing well or get worse. °Document Released: 04/04/2011 Document Reviewed: 04/04/2011 °[employer]® Patient Information ©2015 [employer]. This information is not intended to replace advice given to you by your health care provider. Make sure you discuss any questions you have with your health care provider. ° °

## 2013-12-21 NOTE — Progress Notes (Signed)
No s/s of adverse reaction to pain medication noted.

## 2013-12-21 NOTE — MAU Provider Note (Signed)
History     CSN: [id number]  Arrival date and time: 12/20/13 2037   None     Chief Complaint  Patient presents with  . Vaginal Bleeding  . Abdominal Pain   HPI  Patient is 28 y.o. G2P1001 [id number] here with complaints of vaginal bleeding, passing clots and menstrual like cramps since yesterday  +FM, denies LOF, vaginal discharge.   Past Medical History  Diagnosis Date  . Endometriosis   . Chest pain, atypical     per pt ? from Ultram- upper right chest pain  . Headache(784.0)   . Anxiety   . Depression     no meds  . Shortness of breath     Past Surgical History  Procedure Laterality Date  . Laparoscopy  11/03/2010    Procedure: LAPAROSCOPY DIAGNOSTIC;  Surgeon: Loney LaurenceMichelle A Horvath;  Location: [facility];  Service: Gynecology;;  Chromopertubation  . Cesarean section N/A 05/01/2013    Procedure: CESAREAN SECTION;  Surgeon: Purcell NailsAngela Y Roberts, MD;  Location: [facility];  Service: Obstetrics;  Laterality: N/A;    Family History  Problem Relation Age of Onset  . Alcohol abuse Neg Hx   . Arthritis Neg Hx   . Asthma Neg Hx   . Cancer Neg Hx   . Birth defects Neg Hx   . COPD Neg Hx   . Depression Neg Hx   . Diabetes Neg Hx   . Drug abuse Neg Hx   . Early death Neg Hx   . Hearing loss Neg Hx   . Heart disease Neg Hx   . Hyperlipidemia Neg Hx   . Hypertension Neg Hx   . Kidney disease Neg Hx   . Learning disabilities Neg Hx   . Mental illness Neg Hx   . Mental retardation Neg Hx   . Miscarriages / Stillbirths Neg Hx   . Stroke Neg Hx   . Vision loss Neg Hx   . Varicose Veins Neg Hx     History  Substance Use Topics  . Smoking status: Former Smoker    Types: Cigarettes    Quit date: 06/25/2012  . Smokeless tobacco: Never Used  . Alcohol Use: No     Comment: occasional    Allergies:  Allergies  Allergen Reactions  . Sulfa Antibiotics Nausea And Vomiting    Prescriptions prior to admission  Medication Sig Dispense Refill Last Dose  . Prenatal Vit-Fe  Fumarate-FA (PRENATAL MULTIVITAMIN) TABS tablet Take 1 tablet by mouth daily at 12 noon.   Past Week at Unknown time  . ibuprofen (ADVIL,MOTRIN) 600 MG tablet Take 1 tablet (600 mg total) by mouth every 6 (six) hours as needed for mild pain. 36 tablet 2 prn  . NIFEdipine (PROCARDIA-XL/ADALAT-CC/NIFEDICAL-XL) 30 MG 24 hr tablet Take 1 tablet (30 mg total) by mouth daily. (Patient not taking: Reported on 12/20/2013) 7 tablet 2   . oxyCODONE-acetaminophen (PERCOCET/ROXICET) 5-325 MG per tablet Take 1-2 tablets by mouth every 4 (four) hours as needed for severe pain (moderate - severe pain). 30 tablet 0 prn  . sertraline (ZOLOFT) 50 MG tablet Take 1 tablet (50 mg total) by mouth daily. (Patient not taking: Reported on 12/20/2013) 30 tablet 2   . simethicone (MYLICON) 80 MG chewable tablet Chew 80 mg by mouth every 6 (six) hours as needed for flatulence.   prn  . [DISCONTINUED] HYDROcodone-acetaminophen (NORCO/VICODIN) 5-325 MG per tablet Take 1-2 tablets by mouth every 6 (six) hours as needed for moderate pain (used for lower back pain.).  prn    Review of Systems  Constitutional: Negative for fever and chills.  Respiratory: Negative for cough and shortness of breath.   Cardiovascular: Negative for chest pain and leg swelling.  Gastrointestinal: Negative for heartburn, nausea, vomiting and diarrhea.  Genitourinary: Negative for dysuria, urgency, frequency and hematuria.  Musculoskeletal: Positive for back pain.  Neurological:       No headache   Physical Exam   Blood pressure 125/84, pulse 83, temperature 98.2 F (36.8 C), resp. rate 22, height 5\' 2"  (1.575 m), weight 151 lb 3.2 oz (68.584 kg), last menstrual period 09/18/2013, unknown if currently breastfeeding.  Physical Exam  Constitutional: She is oriented to person, place, and time. She appears well-developed and well-nourished.  HENT:  Head: Normocephalic and atraumatic.  Eyes: Conjunctivae and EOM are normal.  Neck: Normal range of  motion.  Cardiovascular: Normal rate.   Respiratory: Effort normal. No respiratory distress.  GI: Soft. She exhibits no distension. There is no tenderness.  Genitourinary:  Products of conception in vault and in cervical os removed with ring forceps without difficulty.  Musculoskeletal: Normal range of motion. She exhibits no edema.  Neurological: She is alert and oriented to person, place, and time.  Skin: Skin is warm and dry. No erythema.    MAU Course  Procedures  MDM  Labs: Results for orders placed or performed during the hospital encounter of 12/20/13 (from the past 24 hour(s))  Urinalysis, Routine w reflex microscopic   Collection Time: 12/20/13  8:40 PM  Result Value Ref Range   Color, Urine RED (A) YELLOW   APPearance CLOUDY (A) CLEAR   Specific Gravity, Urine >1.030 (H) 1.005 - 1.030   pH 6.0 5.0 - 8.0   Glucose, UA NEGATIVE NEGATIVE mg/dL   Hgb urine dipstick LARGE (A) NEGATIVE   Bilirubin Urine NEGATIVE NEGATIVE   Ketones, ur NEGATIVE NEGATIVE mg/dL   Protein, ur NEGATIVE NEGATIVE mg/dL   Urobilinogen, UA 0.2 0.0 - 1.0 mg/dL   Nitrite NEGATIVE NEGATIVE   Leukocytes, UA NEGATIVE NEGATIVE  Urine microscopic-add on   Collection Time: 12/20/13  8:40 PM  Result Value Ref Range   Squamous Epithelial / LPF FEW (A) RARE   WBC, UA 0-2 <3 WBC/hpf   RBC / HPF TOO NUMEROUS TO COUNT <3 RBC/hpf   Bacteria, UA FEW (A) RARE  Pregnancy, urine POC   Collection Time: 12/20/13  9:14 PM  Result Value Ref Range   Preg Test, Ur POSITIVE (A) NEGATIVE  CBC   Collection Time: 12/20/13 10:25 PM  Result Value Ref Range   WBC 13.4 (H) 4.0 - 10.5 K/uL   RBC 4.15 3.87 - 5.11 MIL/uL   Hemoglobin 12.4 12.0 - 15.0 g/dL   HCT 09.836.2 11.936.0 - 14.746.0 %   MCV 87.2 78.0 - 100.0 fL   MCH 29.9 26.0 - 34.0 pg   MCHC 34.3 30.0 - 36.0 g/dL   RDW 82.914.8 56.211.5 - 13.015.5 %   Platelets 216 150 - 400 K/uL  hCG, quantitative, pregnancy   Collection Time: 12/20/13 10:25 PM  Result Value Ref Range   hCG,  Beta Chain, Quant, S 5733 (H) <5 mIU/mL    Imaging Studies:  [place] Ob Comp Less 14 Wks  12/21/2013   CLINICAL DATA:  28 year old female with heavy bleeding and cramping. Quantitative beta HCG 5,733  EXAM: OBSTETRIC <14 WK US AND TRANSVAGINAL OB US  TECHNIQUE: Both transabdominal and transvaginal ultrasound examinations were performed for complete evaluation of the gestation as well as the  maternal uterus, adnexal regions, and pelvic cul-de-sac. Transvaginal technique was performed to assess early pregnancy.  COMPARISON:  Prior pelvic ultrasound 09/26/2012  FINDINGS: Intrauterine gestational sac: None identified  Yolk sac:  Not identified  Embryo:  Not identified  Maternal uterus/adnexae: Bilateral ovaries and uterus are unremarkable. Probable corpus luteum noted on the left. No evidence of free fluid.  IMPRESSION: No intrauterine gestational sac identified. Differential considerations include very early intrauterine pregnancy, failed intrauterine pregnancy and ectopic pregnancy. Recommend clinical correlation with serial quantitative beta HCG levels in 48 hr.  If clinically warranted, repeat ultrasound could be performed in 7-10 days.   Electronically Signed   By: Malachy Moan M.D.   On: 12/21/2013 00:12   US Ob Transvaginal  12/21/2013   CLINICAL DATA:  28 year old female with heavy bleeding and cramping. Quantitative beta HCG 5,733  EXAM: OBSTETRIC <14 WK [place] AND TRANSVAGINAL OB US  TECHNIQUE: Both transabdominal and transvaginal ultrasound examinations were performed for complete evaluation of the gestation as well as the maternal uterus, adnexal regions, and pelvic cul-de-sac. Transvaginal technique was performed to assess early pregnancy.  COMPARISON:  Prior pelvic ultrasound 09/26/2012  FINDINGS: Intrauterine gestational sac: None identified  Yolk sac:  Not identified  Embryo:  Not identified  Maternal uterus/adnexae: Bilateral ovaries and uterus are unremarkable. Probable corpus luteum noted on the  left. No evidence of free fluid.  IMPRESSION: No intrauterine gestational sac identified. Differential considerations include very early intrauterine pregnancy, failed intrauterine pregnancy and ectopic pregnancy. Recommend clinical correlation with serial quantitative beta HCG levels in 48 hr.  If clinically warranted, repeat ultrasound could be performed in 7-10 days.   Electronically Signed   By: Malachy Moan M.D.   On: 12/21/2013 00:12      Assessment and Plan  Patient is 28 y.o. G2P1001 [redacted]w[redacted]d reporting vaginal bleeding, menstrual cramps, and passing clots - discussed miscarriage, precautions - f/u in clinic with Dr. Claiborne Billings in 2days for repeat quant - rx percocet 5/325mg  #30  Care discussed with Dr. Ardeen Fillers ROCIO 12/21/2013, 12:26 AM

## 2013-12-25 ENCOUNTER — Inpatient Hospital Stay (HOSPITAL_COMMUNITY)
Admission: AD | Admit: 2013-12-25 | Discharge: 2013-12-25 | Disposition: A | Discharge status: Home / Self Care | Payer: BC Managed Care – PPO | Source: Ambulatory Visit | Attending: Obstetrics and Gynecology | Admitting: Obstetrics and Gynecology

## 2013-12-25 ENCOUNTER — Encounter (HOSPITAL_COMMUNITY): Payer: Self-pay

## 2013-12-25 DIAGNOSIS — Z3A14 14 weeks gestation of pregnancy: Secondary | ICD-10-CM | POA: Diagnosis not present

## 2013-12-25 DIAGNOSIS — R109 Unspecified abdominal pain: Secondary | ICD-10-CM | POA: Insufficient documentation

## 2013-12-25 DIAGNOSIS — O039 Complete or unspecified spontaneous abortion without complication: Secondary | ICD-10-CM

## 2013-12-25 HISTORY — DX: Gestational (pregnancy-induced) hypertension without significant proteinuria, unspecified trimester: O13.9

## 2013-12-25 LAB — CBC
HCT: 34 % — ABNORMAL LOW (ref 36.0–46.0)
Hemoglobin: 11.4 g/dL — ABNORMAL LOW (ref 12.0–15.0)
MCH: 29.6 pg (ref 26.0–34.0)
MCHC: 33.5 g/dL (ref 30.0–36.0)
MCV: 88.3 fL (ref 78.0–100.0)
PLATELETS: 217 10*3/uL (ref 150–400)
RBC: 3.85 MIL/uL — AB (ref 3.87–5.11)
RDW: 14.5 % (ref 11.5–15.5)
WBC: 7.2 10*3/uL (ref 4.0–10.5)

## 2013-12-25 LAB — HCG, QUANTITATIVE, PREGNANCY: HCG, BETA CHAIN, QUANT, S: 195 m[IU]/mL — AB (ref <5)

## 2013-12-25 MED ORDER — HYDROMORPHONE HCL 1 MG/ML IJ SOLN
0.5000 mg | Freq: Once | INTRAMUSCULAR | Status: AC
  Administered 2013-12-25: 0.5 mg via INTRAMUSCULAR
  Filled 2013-12-25: qty 1

## 2013-12-25 NOTE — MAU Provider Note (Signed)
History     CSN: [id number]  Arrival date and time: 12/25/13 1654   First Provider Initiated Contact with Patient 12/25/13 1740      Chief Complaint  Patient presents with  . Abdominal Pain  . Vaginal Bleeding   HPI   [name] is a 28 y.o. female G2P1001 who presents with abdominal pain and vaginal bleeding. She was seen here on 11/28 and was told she was having a miscarriage. She was unsure whether to follow up for the miscarriage. She started having heavy bleeding and pain on Saturday. The pain is sharp in her lower abdomen and in her back. She took hydrocodone 1 hour ago.   She was scheduled to be seen in the office on 12/10.   OB History    Gravida Para Term Preterm AB TAB SAB Ectopic Multiple Living   2 1 1  0 0 0 0 0  1      Past Medical History  Diagnosis Date  . Endometriosis   . Chest pain, atypical     per pt ? from Ultram- upper right chest pain  . Headache(784.0)   . Anxiety   . Depression     no meds  . Shortness of breath   . Pregnancy induced hypertension     Past Surgical History  Procedure Laterality Date  . Laparoscopy  11/03/2010    Procedure: LAPAROSCOPY DIAGNOSTIC;  Surgeon: Loney Laurence;  Location: [facility];  Service: Gynecology;;  Chromopertubation  . Cesarean section N/A 05/01/2013    Procedure: CESAREAN SECTION;  Surgeon: Purcell Nails, MD;  Location: [facility];  Service: Obstetrics;  Laterality: N/A;    Family History  Problem Relation Age of Onset  . Alcohol abuse Neg Hx   . Arthritis Neg Hx   . Asthma Neg Hx   . Cancer Neg Hx   . Birth defects Neg Hx   . COPD Neg Hx   . Depression Neg Hx   . Diabetes Neg Hx   . Drug abuse Neg Hx   . Early death Neg Hx   . Hearing loss Neg Hx   . Heart disease Neg Hx   . Hyperlipidemia Neg Hx   . Hypertension Neg Hx   . Kidney disease Neg Hx   . Learning disabilities Neg Hx   . Mental illness Neg Hx   . Mental retardation Neg Hx   . Miscarriages / Stillbirths Neg Hx   .  Stroke Neg Hx   . Vision loss Neg Hx   . Varicose Veins Neg Hx     History  Substance Use Topics  . Smoking status: Current Some Day Smoker    Types: Cigarettes    Last Attempt to Quit: 06/25/2012  . Smokeless tobacco: Never Used  . Alcohol Use: No     Comment: occasional    Allergies:  Allergies  Allergen Reactions  . Sulfa Antibiotics Nausea And Vomiting    Prescriptions prior to admission  Medication Sig Dispense Refill Last Dose  . HYDROcodone-acetaminophen (NORCO/VICODIN) 5-325 MG per tablet Take 1-2 tablets by mouth every 6 (six) hours as needed for moderate pain (used for lower back pain.). 30 tablet 0 12/25/2013 at Unknown time  . ibuprofen (ADVIL,MOTRIN) 600 MG tablet Take 1 tablet (600 mg total) by mouth every 6 (six) hours as needed for mild pain. (Patient not taking: Reported on 12/25/2013) 36 tablet 2 prn  . NIFEdipine (PROCARDIA-XL/ADALAT-CC/NIFEDICAL-XL) 30 MG 24 hr tablet Take 1 tablet (30 mg total)  by mouth daily. (Patient not taking: Reported on 12/20/2013) 7 tablet 2   . oxyCODONE-acetaminophen (PERCOCET/ROXICET) 5-325 MG per tablet Take 1-2 tablets by mouth every 4 (four) hours as needed for severe pain (moderate - severe pain). (Patient not taking: Reported on 12/25/2013) 30 tablet 0 prn  . sertraline (ZOLOFT) 50 MG tablet Take 1 tablet (50 mg total) by mouth daily. (Patient not taking: Reported on 12/20/2013) 30 tablet 2     Results for orders placed or performed during the hospital encounter of 12/25/13 (from the past 48 hour(s))  CBC     Status: Abnormal   Collection Time: 12/25/13  5:56 PM  Result Value Ref Range   WBC 7.2 4.0 - 10.5 K/uL   RBC 3.85 (L) 3.87 - 5.11 MIL/uL   Hemoglobin 11.4 (L) 12.0 - 15.0 g/dL   HCT 16.134.0 (L) 09.636.0 - 04.546.0 %   MCV 88.3 78.0 - 100.0 fL   MCH 29.6 26.0 - 34.0 pg   MCHC 33.5 30.0 - 36.0 g/dL   RDW 40.914.5 81.111.5 - 91.415.5 %   Platelets 217 150 - 400 K/uL  hCG, quantitative, pregnancy     Status: Abnormal   Collection Time: 12/25/13   5:56 PM  Result Value Ref Range   hCG, Beta Chain, Quant, S 195 (H) <5 mIU/mL    Comment:          GEST. AGE      CONC.  (mIU/mL)   <=1 WEEK        5 - 50     2 WEEKS       50 - 500     3 WEEKS       100 - 10,000     4 WEEKS     1,000 - 30,000     5 WEEKS     3,500 - 115,000   6-8 WEEKS     12,000 - 270,000    12 WEEKS     15,000 - 220,000        FEMALE AND NON-PREGNANT FEMALE:     LESS THAN 5 mIU/mL     Review of Systems  Constitutional: Negative for fever and chills.  Gastrointestinal: Positive for abdominal pain (+cramping ). Negative for nausea, vomiting, diarrhea and constipation.  Genitourinary: Negative for dysuria, urgency and frequency.  Musculoskeletal: Positive for back pain.   Physical Exam   Blood pressure 125/75, pulse 63, temperature 98.1 F (36.7 C), temperature source Oral, resp. rate 18, height 5\' 2"  (1.575 m), weight 69.4 kg (153 lb), last menstrual period 09/18/2013, unknown if currently breastfeeding.  Physical Exam  Constitutional: She is oriented to person, place, and time. She appears well-developed and well-nourished. No distress.  HENT:  Head: Normocephalic.  Eyes: Pupils are equal, round, and reactive to light.  Neck: Neck supple.  Respiratory: Effort normal.  GI: Soft. She exhibits no distension. There is no tenderness. There is no rebound.  Genitourinary:  Speculum exam: Vagina - Small amount of dark red, mucoid discharge, no odor Cervix - scant contact bleeding  Bimanual exam: Cervix slightly open Uterus non tender, slightly enlarged  Adnexa non tender, no masses bilaterally Chaperone present for exam.   Musculoskeletal: Normal range of motion.  Neurological: She is alert and oriented to person, place, and time.  Skin: Skin is warm. She is not diaphoretic.  Psychiatric: Her behavior is normal.    MAU Course  Procedures  None  MDM Dilaudid 0.5 mg IM  Discussed patient, HPI, labs and  US with Dr. Dareen PianoAnderson.  Patient rates her pain  5/10 at the time of discharge.  O positive blood type  Assessment and Plan   A: 1. SAB (spontaneous abortion)    P: Discharge home in stable condition Call the office tomorrow to schedule a follow up plan Ok to take ibuprofen as directed on the bottle Bleeding precautions Support given.   Iona HansenJennifer Irene Biana Haggar, NP 12/25/2013 7:01 PM

## 2013-12-25 NOTE — Discharge Instructions (Signed)
Miscarriage °A miscarriage is the loss of an unborn baby (fetus) before the 20th week of pregnancy. The cause is often unknown.  °HOME CARE °· You may need to stay in bed (bed rest), or you may be able to do light activity. Go about activity as told by your doctor. °· Have help at home. °· Write down how many pads you use each day. Write down how soaked they are. °· Do not use tampons. Do not wash out your vagina (douche) or have sex (intercourse) until your doctor approves. °· Only take medicine as told by your doctor. °· Do not take aspirin. °· Keep all doctor visits as told. °· If you or your partner have problems with grieving, talk to your doctor. You can also try counseling. Give yourself time to grieve before trying to get pregnant again. °GET HELP RIGHT AWAY IF: °· You have bad cramps or pain in your back or belly (abdomen). °· You have a fever. °· You pass large clumps of blood (clots) from your vagina that are walnut-sized or larger. Save the clumps for your doctor to see. °· You pass large amounts of tissue from your vagina. Save the tissue for your doctor to see. °· You have more bleeding. °· You have thick, bad-smelling fluid (discharge) coming from the vagina. °· You get lightheaded, weak, or you pass out (faint). °· You have chills. °MAKE SURE YOU: °· Understand these instructions. °· Will watch your condition. °· Will get help right away if you are not doing well or get worse. °Document Released: 04/04/2011 Document Reviewed: 04/04/2011 °[employer]® Patient Information ©2015 [employer]. This information is not intended to replace advice given to you by your health care provider. Make sure you discuss any questions you have with your health care provider. ° °

## 2013-12-25 NOTE — MAU Note (Signed)
Patient states she had a SAB on 11-28. States she has been having pain since that time but the pain is worse and is having heavy bleeding. Changing pads every hour with clots.

## 2015-01-25 NOTE — L&D Delivery Note (Signed)
Cesarean Section Procedure Note  Indications: non-reassuring fetal status  Operative Note    Preoperative Diagnosis: 1. Cat 2-3 tracing                                             2. Remote from delivery                                             3. Prior c/s                                             4. Preecclampsia   Postoperative Diagnosis: Same                                               5. Intrapeitoneal adhesions   Procedure: Repeat Low transverse c/s   Surgeon: Dr Mindi Slicker DO  Anesthesia: Spinal  Fluids: LR EBL: UOP:   Findings: Grossly nl uterus, tubes and ovaries with thin lower uterine segment, mild-moderate intraperitoneal adhesions Viable female infant in cephalic presentation Extra digit on left hand ? Clubbed foot  Specimen: placenta to path   Procedure Note  Patient was taken to the operating room where spinal anesthesia was administered. FHTs confirmed in 120s.  She was prepped and draped in the normal sterile fashion in the dorsal supine position with a leftward tilt. An appropriate time out was performed. Allis clamp test confirmed adequate anesthesia.  A Pfannenstiel skin incision was then made with the scalpel through her previous incision and carried through to the underlying layer of fascia by sharp dissection and Bovie cautery. The fascia was nicked in the midline and the incision was extended laterally with Mayo scissors. The superior and inferior aspects of the incision were grasped with Coker clamps and dissected off the underlying rectus muscles.Rectus muscles were separated in the midline and the peritoneal cavity entered bluntly. Mild to moderate intraperitoneal adhesions were sharply and bluntly dissected away. A bladder flap was created. The Alexis self-retaining wound retractor was then placed within the incision and the lower uterine segment exposed. The thin lower uterine segment was then incised in a transverse fashion  and the cavity itself entered bluntly. The infants head was lifted, a loose nuchal x 1 reduced and the baby was delivered from the incision without difficulty. The remainder of the infant also delivered easily and the nose and mouth bulb suctioned with the cord clamped and cut after 1 minute delay. The infant was handed off to the waiting pediatricians. Cord gases and ph were obtained. The placenta was then spontaneously expressed from the uterus and the uterus cleared of all clots and debris with moist lap sponge. The uterine incision was then repaired in a  running locked layer using 0 chromic - a second layer was imbricated using the same suture. The tubes and ovaries were inspected and the gutters cleared of all clots and debris and irrigated. The uterine incision was inspected again and found to be hemostatic. All instruments and sponges as  well as the Alexis retractor were then removed from the abdomen. The peritoneum was then reapproximated in a purse string fashion and the rectus muscle in a loose figure 8 with 2-0 Vicryl. The fascia was then closed with 0 Vicryl in a running fashion. Subcutaneous tissue was reapproximated with 2-0 plain in a running fashion. The skin was closed with a subcuticular stitch of 4-0 Vicryl on a Keith needle and then reinforced with a honeycomb and pressure dressing. At the conclusion of the procedure all instruments and sponge counts were correct. Patient was taken to the recovery room in good condition with her baby accompanying her skin to skin.

## 2015-04-23 LAB — OB RESULTS CONSOLE HEPATITIS B SURFACE ANTIGEN: Hepatitis B Surface Ag: NEGATIVE

## 2015-04-23 LAB — OB RESULTS CONSOLE GC/CHLAMYDIA
CHLAMYDIA, DNA PROBE: NEGATIVE
GC PROBE AMP, GENITAL: NEGATIVE

## 2015-04-23 LAB — OB RESULTS CONSOLE ABO/RH: RH Type: POSITIVE

## 2015-04-23 LAB — OB RESULTS CONSOLE RUBELLA ANTIBODY, IGM: RUBELLA: IMMUNE

## 2015-04-23 LAB — OB RESULTS CONSOLE HIV ANTIBODY (ROUTINE TESTING): HIV: NONREACTIVE

## 2015-04-23 LAB — OB RESULTS CONSOLE RPR: RPR: NONREACTIVE

## 2015-04-23 LAB — OB RESULTS CONSOLE ANTIBODY SCREEN: ANTIBODY SCREEN: NEGATIVE

## 2015-09-01 ENCOUNTER — Inpatient Hospital Stay (HOSPITAL_COMMUNITY)
Admission: AD | Admit: 2015-09-01 | Discharge: 2015-09-01 | Disposition: A | Discharge status: Home / Self Care | Payer: Managed Care, Other (non HMO) | Source: Ambulatory Visit | Attending: Obstetrics and Gynecology | Admitting: Obstetrics and Gynecology

## 2015-09-01 ENCOUNTER — Encounter (HOSPITAL_COMMUNITY): Payer: Self-pay

## 2015-09-01 DIAGNOSIS — O2341 Unspecified infection of urinary tract in pregnancy, first trimester: Secondary | ICD-10-CM

## 2015-09-01 DIAGNOSIS — Z3A29 29 weeks gestation of pregnancy: Secondary | ICD-10-CM | POA: Diagnosis not present

## 2015-09-01 DIAGNOSIS — O2343 Unspecified infection of urinary tract in pregnancy, third trimester: Secondary | ICD-10-CM | POA: Diagnosis present

## 2015-09-01 LAB — URINE MICROSCOPIC-ADD ON

## 2015-09-01 LAB — CBC
HCT: 32.4 % — ABNORMAL LOW (ref 36.0–46.0)
Hemoglobin: 10.9 g/dL — ABNORMAL LOW (ref 12.0–15.0)
MCH: 28.8 pg (ref 26.0–34.0)
MCHC: 33.6 g/dL (ref 30.0–36.0)
MCV: 85.5 fL (ref 78.0–100.0)
PLATELETS: 182 10*3/uL (ref 150–400)
RBC: 3.79 MIL/uL — ABNORMAL LOW (ref 3.87–5.11)
RDW: 13.3 % (ref 11.5–15.5)
WBC: 10.4 10*3/uL (ref 4.0–10.5)

## 2015-09-01 LAB — URINALYSIS, ROUTINE W REFLEX MICROSCOPIC
Bilirubin Urine: NEGATIVE
Glucose, UA: NEGATIVE mg/dL
Ketones, ur: NEGATIVE mg/dL
NITRITE: POSITIVE — AB
PH: 6.5 (ref 5.0–8.0)
Protein, ur: 100 mg/dL — AB
SPECIFIC GRAVITY, URINE: 1.02 (ref 1.005–1.030)

## 2015-09-01 MED ORDER — PHENAZOPYRIDINE HCL 200 MG PO TABS: 200.0000 mg | ORAL_TABLET | Freq: Three times a day (TID) | ORAL | 0 refills | Status: DC | PRN

## 2015-09-01 MED ORDER — PHENAZOPYRIDINE HCL 100 MG PO TABS
200.0000 mg | ORAL_TABLET | Freq: Once | ORAL | Status: AC
  Administered 2015-09-01: 200 mg via ORAL
  Filled 2015-09-01: qty 2

## 2015-09-01 MED ORDER — NITROFURANTOIN MONOHYD MACRO 100 MG PO CAPS: 100.0000 mg | ORAL_CAPSULE | Freq: Two times a day (BID) | ORAL | 0 refills | Status: DC

## 2015-09-01 MED ORDER — NITROFURANTOIN MONOHYD MACRO 100 MG PO CAPS
100.0000 mg | ORAL_CAPSULE | Freq: Once | ORAL | Status: AC
  Administered 2015-09-01: 100 mg via ORAL
  Filled 2015-09-01: qty 1

## 2015-09-01 NOTE — MAU Provider Note (Signed)
Chief Complaint:  No chief complaint on file.   First Provider Initiated Contact with Patient 09/01/15 0124     HPI: Donna Alvarado is a 30 y.o. G3P1001 at 1339w1d who presents to maternity admissions reporting low abd pressure and pain since last night.   Location: suprapubic Quality: sharp Severity: 8/10 in pain scale Duration: <12 hours Context: none Timing: intrermittent Modifying factors: Worse when voiding Associated signs and symptoms: Pos for frequency of urination  Denies contractions, leakage of fluid or vaginal bleeding. Good fetal movement.   Past Medical History: Past Medical History:  Diagnosis Date  . Anxiety   . Chest pain, atypical    per pt ? from Ultram- upper right chest pain  . Depression    no meds  . Endometriosis   . Headache(784.0)   . Pregnancy induced hypertension   . Shortness of breath     Past obstetric history: OB History  Gravida Para Term Preterm AB Living  3 1 1  0 0 1  SAB TAB Ectopic Multiple Live Births  0 0 0   1    # Outcome Date GA Lbr Len/2nd Weight Sex Delivery Anes PTL Lv  3 Current           2 Term 05/01/13 759w6d  6 lb 13 oz (3.09 kg) M CS-LVertical EPI  LIV  1 Gravida               Past Surgical History: Past Surgical History:  Procedure Laterality Date  . CESAREAN SECTION N/A 05/01/2013   Procedure: CESAREAN SECTION;  Surgeon: Purcell NailsAngela Y Roberts, MD;  Location: [facility];  Service: Obstetrics;  Laterality: N/A;  . LAPAROSCOPY  11/03/2010   Procedure: LAPAROSCOPY DIAGNOSTIC;  Surgeon: Loney LaurenceMichelle A Horvath;  Location: [facility];  Service: Gynecology;;  Chromopertubation     Family History: Family History  Problem Relation Age of Onset  . Alcohol abuse Neg Hx   . Arthritis Neg Hx   . Asthma Neg Hx   . Cancer Neg Hx   . Birth defects Neg Hx   . COPD Neg Hx   . Depression Neg Hx   . Diabetes Neg Hx   . Drug abuse Neg Hx   . Early death Neg Hx   . Hearing loss Neg Hx   . Heart disease Neg Hx   . Hyperlipidemia Neg Hx    . Hypertension Neg Hx   . Kidney disease Neg Hx   . Learning disabilities Neg Hx   . Mental illness Neg Hx   . Mental retardation Neg Hx   . Miscarriages / Stillbirths Neg Hx   . Stroke Neg Hx   . Vision loss Neg Hx   . Varicose Veins Neg Hx     Social History: Social History  Substance Use Topics  . Smoking status: Current Some Day Smoker    Types: Cigarettes    Last attempt to quit: 06/25/2012  . Smokeless tobacco: Never Used  . Alcohol use No     Comment: occasional    Allergies:  Allergies  Allergen Reactions  . Sulfa Antibiotics Nausea And Vomiting    Meds:  Prescriptions Prior to Admission  Medication Sig Dispense Refill Last Dose  . meclizine (ANTIVERT) 32 MG tablet Take 32 mg by mouth 3 (three) times daily as needed.   Past Week at Unknown time  . ondansetron (ZOFRAN-ODT) 8 MG disintegrating tablet Take 8 mg by mouth every 8 (eight) hours as needed for nausea or vomiting.  08/31/2015 at Unknown time  . Prenatal Vit-Fe Fumarate-FA (PRENATAL MULTIVITAMIN) TABS tablet Take 1 tablet by mouth daily at 12 noon.   08/31/2015 at Unknown time  . HYDROcodone-acetaminophen (NORCO/VICODIN) 5-325 MG per tablet Take 1-2 tablets by mouth every 6 (six) hours as needed for moderate pain (used for lower back pain.). 30 tablet 0 12/25/2013 at Unknown time  . ibuprofen (ADVIL,MOTRIN) 600 MG tablet Take 1 tablet (600 mg total) by mouth every 6 (six) hours as needed for mild pain. (Patient not taking: Reported on 12/25/2013) 36 tablet 2 prn  . NIFEdipine (PROCARDIA-XL/ADALAT-CC/NIFEDICAL-XL) 30 MG 24 hr tablet Take 1 tablet (30 mg total) by mouth daily. (Patient not taking: Reported on 12/20/2013) 7 tablet 2   . sertraline (ZOLOFT) 50 MG tablet Take 1 tablet (50 mg total) by mouth daily. (Patient not taking: Reported on 12/20/2013) 30 tablet 2     I have reviewed patient's Past Medical Hx, Surgical Hx, Family Hx, Social Hx, medications and allergies.   ROS:  Review of Systems   Constitutional: Negative for chills and fever.  Gastrointestinal: Positive for abdominal pain. Negative for blood in stool, constipation, diarrhea, nausea and vomiting.  Genitourinary: Positive for dysuria and frequency. Negative for difficulty urinating, flank pain, hematuria, urgency, vaginal bleeding and vaginal discharge.  Neurological: Positive for dizziness.    Physical Exam  Patient Vitals for the past 24 hrs:  BP Temp Temp src Pulse Resp SpO2 Height Weight  09/01/15 0046 124/66 98.8 F (37.1 C) Oral 75 18 100 % 5' 1.5" (1.562 m) 163 lb (73.9 kg)   Constitutional: Well-developed, well-nourished female in mild-mod distress. Standing and rocking at [facility].  Cardiovascular: normal rate Respiratory: normal effort GI: Abd soft, non-tender, gravid appropriate for gestational age. Pos BS x 4 MS: Extremities nontender, no edema, normal ROM Neurologic: Alert and oriented x 4.  GU: Neg CVAT.  Pelvic: NEFG, physiologic discharge, no blood, cervix clean. No CMT    Closed and long  FHT:  Baseline 130 , moderate variability, accelerations present, no decelerations Contractions: None   Labs: Results for orders placed or performed during the hospital encounter of 09/01/15 (from the past 24 hour(s))  Urinalysis, Routine w reflex microscopic (not at [facility])     Status: Abnormal   Collection Time: 09/01/15 12:40 AM  Result Value Ref Range   Color, Urine YELLOW YELLOW   APPearance CLOUDY (A) CLEAR   Specific Gravity, Urine 1.020 1.005 - 1.030   pH 6.5 5.0 - 8.0   Glucose, UA NEGATIVE NEGATIVE mg/dL   Hgb urine dipstick MODERATE (A) NEGATIVE   Bilirubin Urine NEGATIVE NEGATIVE   Ketones, ur NEGATIVE NEGATIVE mg/dL   Protein, ur 324 (A) NEGATIVE mg/dL   Nitrite POSITIVE (A) NEGATIVE   Leukocytes, UA LARGE (A) NEGATIVE  Urine microscopic-add on     Status: Abnormal   Collection Time: 09/01/15 12:40 AM  Result Value Ref Range   Squamous Epithelial / LPF 0-5 (A) NONE SEEN   WBC, UA TOO  NUMEROUS TO COUNT 0 - 5 WBC/hpf   RBC / HPF 0-5 0 - 5 RBC/hpf   Bacteria, UA MANY (A) NONE SEEN   Urine-Other MUCOUS PRESENT   CBC     Status: Abnormal   Collection Time: 09/01/15  1:46 AM  Result Value Ref Range   WBC 10.4 4.0 - 10.5 K/uL   RBC 3.79 (L) 3.87 - 5.11 MIL/uL   Hemoglobin 10.9 (L) 12.0 - 15.0 g/dL   HCT 40.1 (L) 02.7 - 25.3 %  MCV 85.5 78.0 - 100.0 fL   MCH 28.8 26.0 - 34.0 pg   MCHC 33.6 30.0 - 36.0 g/dL   RDW 16.1 09.6 - 04.5 %   Platelets 182 150 - 400 K/uL    Imaging:  No results found.  MAU Course: Orders Placed This Encounter  Procedures  . Culture, OB Urine  . Urinalysis, Routine w reflex microscopic (not at [facility])  . Urine microscopic-add on  . CBC   Meds ordered this encounter  . nitrofurantoin (macrocrystal-monohydrate) (MACROBID) capsule 100 mg  . phenazopyridine (PYRIDIUM) tablet 200 mg   MDM: - Low abd pain in pregnancy 2/2 UTI. No evidence of PTL or other emergent condition. Discussed Hx, exam, labs w/ Dr. Ellyn Hack.   Assessment: 1. UTI (urinary tract infection) during pregnancy, first trimester     Plan: Discharge home in stable condition.  Preterm Labor precautions and fetal kick counts Follow-up Information    [facility] .   Why:  as scheduled or sooner as needed if symptoms do not improve in 3 days.  Contact information: [address]  [address] [place] [address] [phone]        [facility] [place] [facility] .   Why:  as needed if symptoms worsen  Contact information: [address] [id number] mc [place] [address] [phone]            Medication List    STOP taking these medications   HYDROcodone-acetaminophen 5-325 MG tablet Commonly known as:  NORCO/VICODIN   ibuprofen 600 MG tablet Commonly known as:  ADVIL,MOTRIN   NIFEdipine 30 MG 24 hr tablet Commonly known as:  PROCARDIA-XL/ADALAT-CC/NIFEDICAL-XL   sertraline 50 MG  tablet Commonly known as:  ZOLOFT     TAKE these medications   meclizine 32 MG tablet Commonly known as:  ANTIVERT Take 32 mg by mouth 3 (three) times daily as needed.   nitrofurantoin (macrocrystal-monohydrate) 100 MG capsule Commonly known as:  MACROBID Take 1 capsule (100 mg total) by mouth 2 (two) times daily.   ondansetron 8 MG disintegrating tablet Commonly known as:  ZOFRAN-ODT Take 8 mg by mouth every 8 (eight) hours as needed for nausea or vomiting.   phenazopyridine 200 MG tablet Commonly known as:  PYRIDIUM Take 1 tablet (200 mg total) by mouth 3 (three) times daily as needed for pain.   prenatal multivitamin Tabs tablet Take 1 tablet by mouth daily at 12 noon.       [place], CNM 09/01/2015 2:41 AM

## 2015-09-01 NOTE — Discharge Instructions (Signed)
Pregnancy and Urinary Tract Infection  A urinary tract infection (UTI) is a bacterial infection of the urinary tract. Infection of the urinary tract can include the ureters, kidneys (pyelonephritis), bladder (cystitis), and urethra (urethritis). All pregnant women should be screened for bacteria in the urinary tract. Identifying and treating a UTI will decrease the risk of preterm labor and developing more serious infections in both the mother and baby.  CAUSES  Bacteria germs cause almost all UTIs.   RISK FACTORS  Many factors can increase your chances of getting a UTI during pregnancy. These include:  · Having a short urethra.  · Poor toilet and hygiene habits.  · Sexual intercourse.  · Blockage of urine along the urinary tract.  · Problems with the pelvic muscles or nerves.  · Diabetes.  · Obesity.  · Bladder problems after having several children.  · Previous history of UTI.  SIGNS AND SYMPTOMS   · Pain, burning, or a stinging feeling when urinating.  · Suddenly feeling the need to urinate right away (urgency).  · Loss of bladder control (urinary incontinence).  · Frequent urination, more than is common with pregnancy.  · Lower abdominal or back discomfort.  · Cloudy urine.  · Blood in the urine (hematuria).  · Fever.   When the kidneys are infected, the symptoms may be:  · Back pain.  · Flank pain on the right side more so than the left.  · Fever.  · Chills.  · Nausea.  · Vomiting.  DIAGNOSIS   A urinary tract infection is usually diagnosed through urine tests. Additional tests and procedures are sometimes done. These may include:  · Ultrasound exam of the kidneys, ureters, bladder, and urethra.  · Looking in the bladder with a lighted tube (cystoscopy).  TREATMENT  Typically, UTIs can be treated with antibiotic medicines.   HOME CARE INSTRUCTIONS   · Only take over-the-counter or prescription medicines as directed by your health care provider. If you were prescribed antibiotics, take them as directed. Finish  them even if you start to feel better.  · Drink enough fluids to keep your urine clear or pale yellow.  · Do not have sexual intercourse until the infection is gone and your health care provider says it is okay.  · Make sure you are tested for UTIs throughout your pregnancy. These infections often come back.   Preventing a UTI in the Future  · Practice good toilet habits. Always wipe from front to back. Use the tissue only once.  · Do not hold your urine. Empty your bladder as soon as possible when the urge comes.  · Do not douche or use deodorant sprays.  · Wash with soap and warm water around the genital area and the anus.  · Empty your bladder before and after sexual intercourse.  · Wear underwear with a cotton crotch.  · Avoid caffeine and carbonated drinks. They can irritate the bladder.  · Drink cranberry juice or take cranberry pills. This may decrease the risk of getting a UTI.  · Do not drink alcohol.  · Keep all your appointments and tests as scheduled.   SEEK MEDICAL CARE IF:   · Your symptoms get worse.  · You are still having fevers 2 or more days after treatment begins.  · You have a rash.  · You feel that you are having problems with medicines prescribed.  · You have abnormal vaginal discharge.  SEEK IMMEDIATE MEDICAL CARE IF:   · You have back or flank   pain.  · You have chills.  · You have blood in your urine.  · You have nausea and vomiting.  · You have contractions of your uterus.  · You have a gush of fluid from the vagina.  MAKE SURE YOU:  · Understand these instructions.    · Will watch your condition.    · Will get help right away if you are not doing well or get worse.       This information is not intended to replace advice given to you by your health care provider. Make sure you discuss any questions you have with your health care provider.     Document Released: 05/07/2010 Document Revised: 10/31/2012 Document Reviewed: 08/09/2012  Elsevier Interactive Patient Education ©2016 Elsevier  Inc.

## 2015-09-01 NOTE — MAU Note (Signed)
Urine in lab 

## 2015-09-01 NOTE — MAU Provider Note (Signed)
  NST 120-230, good var; 10x7910min accel, category 1; app for 29 wk

## 2015-09-03 LAB — CULTURE, OB URINE
Culture: >=100000 — AB
Special Requests: NORMAL

## 2015-09-16 ENCOUNTER — Inpatient Hospital Stay (HOSPITAL_COMMUNITY)
Admission: AD | Admit: 2015-09-16 | Discharge: 2015-09-17 | Disposition: A | Discharge status: Home / Self Care | Payer: Managed Care, Other (non HMO) | Source: Ambulatory Visit | Attending: Obstetrics and Gynecology | Admitting: Obstetrics and Gynecology

## 2015-09-16 ENCOUNTER — Encounter (HOSPITAL_COMMUNITY): Payer: Self-pay

## 2015-09-16 DIAGNOSIS — N898 Other specified noninflammatory disorders of vagina: Secondary | ICD-10-CM | POA: Diagnosis not present

## 2015-09-16 DIAGNOSIS — O99333 Smoking (tobacco) complicating pregnancy, third trimester: Secondary | ICD-10-CM | POA: Diagnosis not present

## 2015-09-16 DIAGNOSIS — Z3A31 31 weeks gestation of pregnancy: Secondary | ICD-10-CM | POA: Insufficient documentation

## 2015-09-16 DIAGNOSIS — F419 Anxiety disorder, unspecified: Secondary | ICD-10-CM | POA: Insufficient documentation

## 2015-09-16 DIAGNOSIS — O26893 Other specified pregnancy related conditions, third trimester: Secondary | ICD-10-CM | POA: Diagnosis not present

## 2015-09-16 DIAGNOSIS — R102 Pelvic and perineal pain: Secondary | ICD-10-CM | POA: Diagnosis present

## 2015-09-16 DIAGNOSIS — O4693 Antepartum hemorrhage, unspecified, third trimester: Secondary | ICD-10-CM | POA: Diagnosis not present

## 2015-09-16 DIAGNOSIS — F329 Major depressive disorder, single episode, unspecified: Secondary | ICD-10-CM | POA: Insufficient documentation

## 2015-09-16 DIAGNOSIS — O99343 Other mental disorders complicating pregnancy, third trimester: Secondary | ICD-10-CM | POA: Diagnosis not present

## 2015-09-16 LAB — URINALYSIS, ROUTINE W REFLEX MICROSCOPIC
Bilirubin Urine: NEGATIVE
Glucose, UA: NEGATIVE mg/dL
Ketones, ur: 40 mg/dL — AB
LEUKOCYTES UA: NEGATIVE
NITRITE: NEGATIVE
PROTEIN: NEGATIVE mg/dL
pH: 6 (ref 5.0–8.0)

## 2015-09-16 LAB — URINE MICROSCOPIC-ADD ON

## 2015-09-16 NOTE — MAU Note (Signed)
Pt reports a lot of pressure on her left side and spotting today. Denies dysuria. Some vaginal itching.

## 2015-09-17 ENCOUNTER — Inpatient Hospital Stay (HOSPITAL_COMMUNITY): Payer: Managed Care, Other (non HMO)

## 2015-09-17 DIAGNOSIS — O4693 Antepartum hemorrhage, unspecified, third trimester: Secondary | ICD-10-CM | POA: Diagnosis not present

## 2015-09-17 DIAGNOSIS — N898 Other specified noninflammatory disorders of vagina: Secondary | ICD-10-CM

## 2015-09-17 DIAGNOSIS — O26893 Other specified pregnancy related conditions, third trimester: Secondary | ICD-10-CM

## 2015-09-17 LAB — WET PREP, GENITAL
Clue Cells Wet Prep HPF POC: NONE SEEN
SPERM: NONE SEEN
Trich, Wet Prep: NONE SEEN
Yeast Wet Prep HPF POC: NONE SEEN

## 2015-09-17 NOTE — MAU Provider Note (Signed)
Chief Complaint:  No chief complaint on file.   None    HPI: Donna Alvarado is a 30 y.o. G3P1011 at 6067w3d who presents to maternity admissions reporting pelvic pressure/pain  Pain is described as pulling sensation in pelvic area/vagina bilaterally and on sides of stomach, occurs irregularly, worse with walking.  +VB, started today, pink with wiping, but recently has become more bright red blood this evening. No clots. Not bleeding onto underwear.    Denies contractions or leakage of fluid. Good fetal movement.   Pregnancy Course:   Past Medical History: Past Medical History:  Diagnosis Date  . Anxiety   . Chest pain, atypical    per pt ? from Ultram- upper right chest pain  . Depression    no meds  . Endometriosis   . Headache(784.0)   . Pregnancy induced hypertension   . Shortness of breath     Past obstetric history: OB History  Gravida Para Term Preterm AB Living  3 1 1  0 1 1  SAB TAB Ectopic Multiple Live Births  1 0 0   1    # Outcome Date GA Lbr Len/2nd Weight Sex Delivery Anes PTL Lv  3 Current           2 Term 05/01/13 2338w6d  6 lb 13 oz (3.09 kg) M CS-LVertical EPI  LIV  1 SAB               Past Surgical History: Past Surgical History:  Procedure Laterality Date  . CESAREAN SECTION N/A 05/01/2013   Procedure: CESAREAN SECTION;  Surgeon: Purcell NailsAngela Y Roberts, MD;  Location: [facility];  Service: Obstetrics;  Laterality: N/A;  . LAPAROSCOPY  11/03/2010   Procedure: LAPAROSCOPY DIAGNOSTIC;  Surgeon: Loney LaurenceMichelle A Horvath;  Location: [facility];  Service: Gynecology;;  Chromopertubation     Family History: Family History  Problem Relation Age of Onset  . Alcohol abuse Neg Hx   . Arthritis Neg Hx   . Asthma Neg Hx   . Cancer Neg Hx   . Birth defects Neg Hx   . COPD Neg Hx   . Depression Neg Hx   . Diabetes Neg Hx   . Drug abuse Neg Hx   . Early death Neg Hx   . Hearing loss Neg Hx   . Heart disease Neg Hx   . Hyperlipidemia Neg Hx   . Hypertension Neg Hx   .  Kidney disease Neg Hx   . Learning disabilities Neg Hx   . Mental illness Neg Hx   . Mental retardation Neg Hx   . Miscarriages / Stillbirths Neg Hx   . Stroke Neg Hx   . Vision loss Neg Hx   . Varicose Veins Neg Hx     Social History: Social History  Substance Use Topics  . Smoking status: Current Some Day Smoker    Types: Cigarettes    Last attempt to quit: 06/25/2012  . Smokeless tobacco: Never Used  . Alcohol use No     Comment: occasional    Allergies:  Allergies  Allergen Reactions  . Sulfa Antibiotics Nausea And Vomiting    Meds:  Prescriptions Prior to Admission  Medication Sig Dispense Refill Last Dose  . meclizine (ANTIVERT) 32 MG tablet Take 32 mg by mouth 3 (three) times daily as needed.   Past Week at Unknown time  . nitrofurantoin, macrocrystal-monohydrate, (MACROBID) 100 MG capsule Take 1 capsule (100 mg total) by mouth 2 (two) times daily. 14 capsule 0   .  ondansetron (ZOFRAN-ODT) 8 MG disintegrating tablet Take 8 mg by mouth every 8 (eight) hours as needed for nausea or vomiting.   08/31/2015 at Unknown time  . phenazopyridine (PYRIDIUM) 200 MG tablet Take 1 tablet (200 mg total) by mouth 3 (three) times daily as needed for pain. 15 tablet 0   . Prenatal Vit-Fe Fumarate-FA (PRENATAL MULTIVITAMIN) TABS tablet Take 1 tablet by mouth daily at 12 noon.   08/31/2015 at Unknown time    I have reviewed patient's Past Medical Hx, Surgical Hx, Family Hx, Social Hx, medications and allergies.   ROS:  Review of Systems A comprehensive ROS was negative except per HPI.    Physical Exam  Patient Vitals for the past 24 hrs:  BP Temp Temp src Pulse Resp SpO2 Height Weight  09/16/15 2210 134/61 98.3 F (36.8 C) Oral 74 18 98 % 5' 1.5" (1.562 m) 164 lb (74.4 kg)   Constitutional: Well-developed, well-nourished female in no acute distress.  Cardiovascular: normal rate Respiratory: normal effort GI: Abd soft, non-tender, gravid appropriate for gestational age. Pos BS x  4 MS: Extremities nontender, no edema, normal ROM Neurologic: Alert and oriented x 4.  GU: Neg CVAT.  Pelvic: NEFG, physiologic discharge, NO blood, cervix clean. No CMT   Cervix: 0/thick  FHT:  Baseline 125 , moderate variability, accelerations present, no decelerations Contractions: rare   Labs: Results for orders placed or performed during the hospital encounter of 09/16/15 (from the past 24 hour(s))  Urinalysis, Routine w reflex microscopic (not at [facility])     Status: Abnormal   Collection Time: 09/16/15 10:10 PM  Result Value Ref Range   Color, Urine YELLOW YELLOW   APPearance CLEAR CLEAR   Specific Gravity, Urine >1.030 (H) 1.005 - 1.030   pH 6.0 5.0 - 8.0   Glucose, UA NEGATIVE NEGATIVE mg/dL   Hgb urine dipstick LARGE (A) NEGATIVE   Bilirubin Urine NEGATIVE NEGATIVE   Ketones, ur 40 (A) NEGATIVE mg/dL   Protein, ur NEGATIVE NEGATIVE mg/dL   Nitrite NEGATIVE NEGATIVE   Leukocytes, UA NEGATIVE NEGATIVE  Urine microscopic-add on     Status: Abnormal   Collection Time: 09/16/15 10:10 PM  Result Value Ref Range   Squamous Epithelial / LPF 6-30 (A) NONE SEEN   WBC, UA 0-5 0 - 5 WBC/hpf   RBC / HPF 0-5 0 - 5 RBC/hpf   Bacteria, UA FEW (A) NONE SEEN   Urine-Other MUCOUS PRESENT     Imaging:  No results found.  MAU Course: UA - neg (dehydrated) Wet Prep - neg Gc/Chl - pending IVF bolus  Spoke with Dr. Ellyn Hack who agrees with the plan, follow up in office.  MDM:  Plan of care reviewed with patient, including labs and tests ordered and medical treatment.    Assessment: 1. Vaginal discharge during pregnancy in third trimester   2. Vaginal bleeding in pregnancy, third trimester     Plan: Discharge home in stable condition.  Preterm labor precautions and fetal kick counts Bleeding precautions given     Medication List    TAKE these medications   meclizine 32 MG tablet Commonly known as:  ANTIVERT Take 32 mg by mouth 3 (three) times daily as needed.    nitrofurantoin (macrocrystal-monohydrate) 100 MG capsule Commonly known as:  MACROBID Take 1 capsule (100 mg total) by mouth 2 (two) times daily.   ondansetron 8 MG disintegrating tablet Commonly known as:  ZOFRAN-ODT Take 8 mg by mouth every 8 (eight) hours as needed for  nausea or vomiting.   phenazopyridine 200 MG tablet Commonly known as:  PYRIDIUM Take 1 tablet (200 mg total) by mouth 3 (three) times daily as needed for pain.   prenatal multivitamin Tabs tablet Take 1 tablet by mouth daily at 12 noon.       [address] [place], [place] 09/17/2015 12:11 AM

## 2015-09-17 NOTE — Discharge Instructions (Signed)
Vaginal Bleeding During Pregnancy, Third Trimester  A small amount of bleeding (spotting) from the vagina is common in pregnancy. Sometimes the bleeding is normal and is not a problem, and sometimes it is a sign of something serious. Be sure to tell your doctor about any bleeding from your vagina right away. HOME CARE  Watch your condition for any changes.  Follow your doctor's instructions about how active you can be.  If you are on bed rest:  You may need to stay in bed and only get up to use the bathroom.  You may be allowed to do some activities.  If you need help, make plans for someone to help you.  Write down:  The number of pads you use each day.  How often you change pads.  How soaked (saturated) your pads are.  Do not use tampons.  Do not douche.  Do not have sex or orgasms until your doctor says it is okay.  Follow your doctor's advice about lifting, driving, and doing physical activities.  If you pass any tissue from your vagina, save the tissue so you can show it to your doctor.  Only take medicines as told by your doctor.  Do not take aspirin because it can make you bleed.  Keep all follow-up visits as told by your doctor. GET HELP IF:   You bleed from your vagina.  You have cramps.  You have labor pains.  You have a fever that does not go away after you take medicine. GET HELP RIGHT AWAY IF:  You have very bad cramps in your back or belly (abdomen).  You have chills.  You have a gush of fluid from your vagina.  You pass large clots or tissue from your vagina.  You bleed more.  You feel light-headed or weak.  You pass out (faint).  You do not feel your baby move around as much as before. MAKE SURE YOU:  Understand these instructions.  Will watch your condition.  Will get help right away if you are not doing well or get worse.   This information is not intended to replace advice given to you by your health care provider. Make sure  you discuss any questions you have with your health care provider.   Document Released: 05/27/2013 Document Reviewed: 05/27/2013 Elsevier Interactive Patient Education [employer]2016 Elsevier Inc.

## 2015-09-18 LAB — GC/CHLAMYDIA PROBE AMP (~~LOC~~) NOT AT ARMC
Chlamydia: NEGATIVE
NEISSERIA GONORRHEA: NEGATIVE

## 2015-10-16 LAB — OB RESULTS CONSOLE GBS: STREP GROUP B AG: NEGATIVE

## 2015-11-10 ENCOUNTER — Telehealth (HOSPITAL_COMMUNITY): Payer: Self-pay | Admitting: *Deleted

## 2015-11-10 NOTE — Telephone Encounter (Signed)
Preadmission screen  

## 2015-11-11 ENCOUNTER — Telehealth (HOSPITAL_COMMUNITY): Payer: Self-pay | Admitting: *Deleted

## 2015-11-11 NOTE — Telephone Encounter (Signed)
Preadmission screen Voicemail left with significant other number

## 2015-11-12 ENCOUNTER — Encounter (HOSPITAL_COMMUNITY): Payer: Self-pay

## 2015-11-12 ENCOUNTER — Telehealth (HOSPITAL_COMMUNITY): Payer: Self-pay | Admitting: *Deleted

## 2015-11-12 NOTE — Telephone Encounter (Signed)
Preadmission screen  

## 2015-11-13 NOTE — H&P (Signed)
Donna Alvarado is a 30 y.o. female presenting for scheduled iol for precclampsia. Pt was seen in office today with BP of 160/98. She reported headaches not relieved with tylenol, blurry vision. Weight gain of 11lbs in past week noted- she c/o feeling "bigger" and generally unwell/fatigued. Reviewed options for delivery - TOLAC given unfavorable cervix vs repeat c/s. R/B reviewed and pt opts for TOLAC.  Will plan for iol at midnight today with cytotec  OB History    Gravida Para Term Preterm AB Living   3 1 1  0 1 1   SAB TAB Ectopic Multiple Live Births   1 0 0   1     Past Medical History:  Diagnosis Date  . Anxiety   . Chest pain, atypical    per pt ? from Ultram- upper right chest pain  . Depression    had PP depression with meds, had harmful thoughts dueing this time  . Endometriosis   . Headache(784.0)   . Pregnancy induced hypertension   . Shortness of breath    Past Surgical History:  Procedure Laterality Date  . CESAREAN SECTION N/A 05/01/2013   Procedure: CESAREAN SECTION;  Surgeon: Purcell Nails, MD;  Location: [facility];  Service: Obstetrics;  Laterality: N/A;  . LAPAROSCOPY  11/03/2010   Procedure: LAPAROSCOPY DIAGNOSTIC;  Surgeon: Loney Laurence;  Location: [facility];  Service: Gynecology;;  Chromopertubation   Family History: family history is not on file. Social History:  reports that she quit smoking about 3 years ago. Her smoking use included Cigarettes. She has never used smokeless tobacco. She reports that she does not drink alcohol or use drugs.     Maternal Diabetes: No Genetic Screening: Normal Maternal Ultrasounds/Referrals: Normal Fetal Ultrasounds or other Referrals:  None Maternal Substance Abuse:  No Significant Maternal Medications:  None Significant Maternal Lab Results:  Lab values include: Group B Strep negative Other Comments:  None  Review of Systems  Constitutional: Positive for malaise/fatigue. Negative for chills, fever and weight loss.   Eyes: Positive for blurred vision, double vision and photophobia.  Respiratory: Positive for shortness of breath.   Cardiovascular: Positive for leg swelling. Negative for chest pain.  Gastrointestinal: Negative for abdominal pain, heartburn, nausea and vomiting.  Genitourinary: Negative for dysuria, flank pain and frequency.  Musculoskeletal: Positive for back pain and myalgias.  Skin: Negative for itching and rash.  Neurological: Positive for headaches. Negative for dizziness, focal weakness and seizures.  Endo/Heme/Allergies: Does not bruise/bleed easily.  Psychiatric/Behavioral: Negative for depression, hallucinations, substance abuse and suicidal ideas. The patient is not nervous/anxious.    Maternal Medical History:  Reason for admission: Nausea. Preeclampsia at term  Fetal activity: Perceived fetal activity is decreased.   Last perceived fetal movement was within the past hour.    Prenatal complications: PIH and pre-eclampsia.   Prenatal Complications - Diabetes: none.      unknown if currently breastfeeding. Maternal Exam:  Uterine Assessment: Contraction frequency is rare.   Abdomen: Patient reports generalized tenderness.  Surgical scars: low transverse.   Fundal height is 41cm.   Fetal presentation: vertex  Introitus: Normal vulva. Normal vagina.  Pelvis: adequate for delivery.   Cervix: Cervix evaluated by digital exam.     Physical Exam  Constitutional: She is oriented to person, place, and time. She appears well-developed and well-nourished.  HENT:  Head: Normocephalic.  Neck: Normal range of motion.  Cardiovascular: Normal rate.   Respiratory: Effort normal.  GI: There is generalized tenderness.  Genitourinary:  Vagina normal and uterus normal.  Musculoskeletal: Normal range of motion. She exhibits edema.  Neurological: She is oriented to person, place, and time.  Skin: Skin is warm.  Psychiatric: She has a normal mood and affect. Her behavior is  normal. Thought content normal.    Prenatal labs: ABO, Rh: O/Positive/-- (03/30 0000) Antibody: Negative (03/30 0000) Rubella: Immune (03/30 0000) RPR: Nonreactive (03/30 0000)  HBsAg: Negative (03/30 0000)  HIV: Non-reactive (03/30 0000)  GBS: Negative (09/22 0000)   Assessment/Plan: [id number] at 39 4/[redacted] weeks ga with preeclampsia; hx c/s Pt to birthing center tonight for iol with cytotec; TOLAC AROM when able Pain control prn   Donna Alvarado 11/13/2015, 5:11 PM

## 2015-11-14 ENCOUNTER — Encounter (HOSPITAL_COMMUNITY): Payer: Self-pay | Admitting: Anesthesiology

## 2015-11-14 ENCOUNTER — Inpatient Hospital Stay (HOSPITAL_COMMUNITY): Admission: RE | Admit: 2015-11-14 | Payer: Managed Care, Other (non HMO) | Source: Ambulatory Visit

## 2015-11-14 ENCOUNTER — Encounter (HOSPITAL_COMMUNITY): Payer: Self-pay | Admitting: *Deleted

## 2015-11-14 ENCOUNTER — Encounter (HOSPITAL_COMMUNITY)
Admission: AD | Disposition: A | Discharge status: Home / Self Care | Payer: Self-pay | Source: Ambulatory Visit | Attending: Obstetrics and Gynecology

## 2015-11-14 ENCOUNTER — Inpatient Hospital Stay (HOSPITAL_COMMUNITY)
Admission: AD | Admit: 2015-11-14 | Discharge: 2015-11-18 | DRG: 766 | Disposition: A | Discharge status: Home / Self Care | Payer: Managed Care, Other (non HMO) | Source: Ambulatory Visit | Attending: Obstetrics and Gynecology | Admitting: Obstetrics and Gynecology

## 2015-11-14 ENCOUNTER — Inpatient Hospital Stay (HOSPITAL_COMMUNITY): Payer: Managed Care, Other (non HMO) | Admitting: Anesthesiology

## 2015-11-14 DIAGNOSIS — O1494 Unspecified pre-eclampsia, complicating childbirth: Secondary | ICD-10-CM | POA: Diagnosis present

## 2015-11-14 DIAGNOSIS — O1493 Unspecified pre-eclampsia, third trimester: Secondary | ICD-10-CM | POA: Diagnosis present

## 2015-11-14 DIAGNOSIS — Z3A39 39 weeks gestation of pregnancy: Secondary | ICD-10-CM | POA: Diagnosis not present

## 2015-11-14 DIAGNOSIS — O34211 Maternal care for low transverse scar from previous cesarean delivery: Principal | ICD-10-CM | POA: Diagnosis present

## 2015-11-14 DIAGNOSIS — O368390 Maternal care for abnormalities of the fetal heart rate or rhythm, unspecified trimester, not applicable or unspecified: Secondary | ICD-10-CM

## 2015-11-14 LAB — CBC
HCT: 28.7 % — ABNORMAL LOW (ref 36.0–46.0)
HEMATOCRIT: 30 % — AB (ref 36.0–46.0)
HEMATOCRIT: 30.3 % — AB (ref 36.0–46.0)
Hemoglobin: 9.8 g/dL — ABNORMAL LOW (ref 12.0–15.0)
Hemoglobin: 10 g/dL — ABNORMAL LOW (ref 12.0–15.0)
Hemoglobin: 10.1 g/dL — ABNORMAL LOW (ref 12.0–15.0)
MCH: 26.8 pg (ref 26.0–34.0)
MCH: 26.4 pg (ref 26.0–34.0)
MCH: 26.8 pg (ref 26.0–34.0)
MCHC: 34.1 g/dL (ref 30.0–36.0)
MCHC: 33.3 g/dL (ref 30.0–36.0)
MCHC: 33.3 g/dL (ref 30.0–36.0)
MCV: 78.4 fL (ref 78.0–100.0)
MCV: 79.2 fL (ref 78.0–100.0)
MCV: 80.4 fL (ref 78.0–100.0)
PLATELETS: 171 10*3/uL (ref 150–400)
PLATELETS: 180 10*3/uL (ref 150–400)
Platelets: DECREASED 10*3/uL (ref 150–400)
RBC: 3.66 MIL/uL — ABNORMAL LOW (ref 3.87–5.11)
RBC: 3.79 MIL/uL — ABNORMAL LOW (ref 3.87–5.11)
RBC: 3.77 MIL/uL — ABNORMAL LOW (ref 3.87–5.11)
RDW: 14.1 % (ref 11.5–15.5)
RDW: 14.2 % (ref 11.5–15.5)
RDW: 14.4 % (ref 11.5–15.5)
WBC: 7.1 10*3/uL (ref 4.0–10.5)
WBC: 7.9 10*3/uL (ref 4.0–10.5)
WBC: 11.4 10*3/uL — ABNORMAL HIGH (ref 4.0–10.5)

## 2015-11-14 LAB — PROTEIN / CREATININE RATIO, URINE
Creatinine, Urine: 473 mg/dL
PROTEIN CREATININE RATIO: 3.52 mg/mg{creat} — AB (ref 0.00–0.15)
Total Protein, Urine: 1663 mg/dL

## 2015-11-14 LAB — COMPREHENSIVE METABOLIC PANEL
ALBUMIN: 2.2 g/dL — AB (ref 3.5–5.0)
ALT: 30 U/L (ref 14–54)
AST: 25 U/L (ref 15–41)
Alkaline Phosphatase: 162 U/L — ABNORMAL HIGH (ref 38–126)
Anion gap: 6 (ref 5–15)
BUN: 23 mg/dL — AB (ref 6–20)
CHLORIDE: 108 mmol/L (ref 101–111)
CO2: 20 mmol/L — ABNORMAL LOW (ref 22–32)
Calcium: 8.2 mg/dL — ABNORMAL LOW (ref 8.9–10.3)
Creatinine, Ser: 0.8 mg/dL (ref 0.44–1.00)
GFR calc Af Amer: >60 mL/min (ref >60)
Glucose, Bld: 76 mg/dL (ref 65–99)
POTASSIUM: 4 mmol/L (ref 3.5–5.1)
Sodium: 134 mmol/L — ABNORMAL LOW (ref 135–145)
Total Bilirubin: 0.6 mg/dL (ref 0.3–1.2)
Total Protein: 5.8 g/dL — ABNORMAL LOW (ref 6.5–8.1)

## 2015-11-14 LAB — RPR: RPR: NONREACTIVE

## 2015-11-14 LAB — HEPATIC FUNCTION PANEL
ALT: 27 U/L (ref 14–54)
AST: 25 U/L (ref 15–41)
Albumin: 2.1 g/dL — ABNORMAL LOW (ref 3.5–5.0)
Alkaline Phosphatase: 160 U/L — ABNORMAL HIGH (ref 38–126)
BILIRUBIN TOTAL: 0.3 mg/dL (ref 0.3–1.2)
Total Protein: 5.7 g/dL — ABNORMAL LOW (ref 6.5–8.1)

## 2015-11-14 LAB — FIBRINOGEN: Fibrinogen: 523 mg/dL — ABNORMAL HIGH (ref 210–475)

## 2015-11-14 LAB — APTT: APTT: 28 s (ref 24–36)

## 2015-11-14 LAB — PROTIME-INR
INR: 1.13
PROTHROMBIN TIME: 14.6 s (ref 11.4–15.2)

## 2015-11-14 LAB — URIC ACID: Uric Acid, Serum: 8 mg/dL — ABNORMAL HIGH (ref 2.3–6.6)

## 2015-11-14 LAB — LACTATE DEHYDROGENASE: LDH: 265 U/L — AB (ref 98–192)

## 2015-11-14 SURGERY — Surgical Case
Anesthesia: Spinal

## 2015-11-14 MED ORDER — LABETALOL HCL 5 MG/ML IV SOLN
20.0000 mg | INTRAVENOUS | Status: AC | PRN
  Administered 2015-11-16: 20 mg via INTRAVENOUS
  Administered 2015-11-17: 40 mg via INTRAVENOUS
  Administered 2015-11-17: 20 mg via INTRAVENOUS
  Filled 2015-11-14: qty 8
  Filled 2015-11-14 (×2): qty 4

## 2015-11-14 MED ORDER — OXYTOCIN BOLUS FROM INFUSION: 500.0000 mL | Freq: Once | INTRAVENOUS | Status: DC

## 2015-11-14 MED ORDER — MORPHINE SULFATE-NACL 0.5-0.9 MG/ML-% IV SOSY
PREFILLED_SYRINGE | INTRAVENOUS | Status: AC
  Filled 2015-11-14: qty 1

## 2015-11-14 MED ORDER — PROMETHAZINE HCL 25 MG/ML IJ SOLN
6.2500 mg | INTRAMUSCULAR | Status: DC | PRN
Start: 2015-11-14 — End: 2015-11-14
  Administered 2015-11-14: 12.5 mg via INTRAVENOUS

## 2015-11-14 MED ORDER — DIPHENHYDRAMINE HCL 25 MG PO CAPS
25.0000 mg | ORAL_CAPSULE | Freq: Four times a day (QID) | ORAL | Status: DC | PRN
  Administered 2015-11-15 – 2015-11-16 (×4): 25 mg via ORAL
  Filled 2015-11-14 (×5): qty 1

## 2015-11-14 MED ORDER — ONDANSETRON HCL 4 MG/2ML IJ SOLN
4.0000 mg | Freq: Four times a day (QID) | INTRAMUSCULAR | Status: DC | PRN
  Filled 2015-11-14: qty 2

## 2015-11-14 MED ORDER — MENTHOL 3 MG MT LOZG: 1.0000 | LOZENGE | OROMUCOSAL | Status: DC | PRN

## 2015-11-14 MED ORDER — ZOLPIDEM TARTRATE 5 MG PO TABS: 5.0000 mg | ORAL_TABLET | Freq: Every evening | ORAL | Status: DC | PRN

## 2015-11-14 MED ORDER — OXYTOCIN 40 UNITS IN LACTATED RINGERS INFUSION - SIMPLE MED
1.0000 m[IU]/min | INTRAVENOUS | Status: DC
  Administered 2015-11-14: 2 m[IU]/min via INTRAVENOUS

## 2015-11-14 MED ORDER — OXYTOCIN 40 UNITS IN LACTATED RINGERS INFUSION - SIMPLE MED
2.5000 [IU]/h | INTRAVENOUS | Status: DC
  Filled 2015-11-14: qty 1000

## 2015-11-14 MED ORDER — CEFAZOLIN SODIUM-DEXTROSE 2-4 GM/100ML-% IV SOLN
INTRAVENOUS | Status: AC
  Filled 2015-11-14: qty 100

## 2015-11-14 MED ORDER — LABETALOL HCL 5 MG/ML IV SOLN
INTRAVENOUS | Status: AC
  Filled 2015-11-14: qty 4

## 2015-11-14 MED ORDER — LABETALOL HCL 5 MG/ML IV SOLN
20.0000 mg | INTRAVENOUS | Status: AC | PRN
  Administered 2015-11-14: 40 mg via INTRAVENOUS
  Administered 2015-11-14: 20 mg via INTRAVENOUS
  Administered 2015-11-14: 80 mg via INTRAVENOUS
  Filled 2015-11-14: qty 8
  Filled 2015-11-14: qty 16

## 2015-11-14 MED ORDER — CEFAZOLIN SODIUM-DEXTROSE 2-3 GM-% IV SOLR
INTRAVENOUS | Status: DC | PRN
  Administered 2015-11-14: 2 g via INTRAVENOUS

## 2015-11-14 MED ORDER — HYDRALAZINE HCL 20 MG/ML IJ SOLN
10.0000 mg | Freq: Once | INTRAMUSCULAR | Status: AC | PRN
  Administered 2015-11-14: 03:00:00 via INTRAVENOUS
  Filled 2015-11-14: qty 1

## 2015-11-14 MED ORDER — SIMETHICONE 80 MG PO CHEW
80.0000 mg | CHEWABLE_TABLET | Freq: Three times a day (TID) | ORAL | Status: DC
  Administered 2015-11-14 – 2015-11-18 (×13): 80 mg via ORAL
  Filled 2015-11-14 (×18): qty 1

## 2015-11-14 MED ORDER — LACTATED RINGERS IV SOLN: 500.0000 mL | INTRAVENOUS | Status: DC | PRN

## 2015-11-14 MED ORDER — PROMETHAZINE HCL 25 MG/ML IJ SOLN
INTRAMUSCULAR | Status: AC
  Filled 2015-11-14: qty 1

## 2015-11-14 MED ORDER — SCOPOLAMINE 1 MG/3DAYS TD PT72
MEDICATED_PATCH | TRANSDERMAL | Status: DC | PRN
  Administered 2015-11-14: 1 via TRANSDERMAL

## 2015-11-14 MED ORDER — COCONUT OIL OIL
1.0000 "application " | TOPICAL_OIL | Status: DC | PRN
  Administered 2015-11-18: 1 via TOPICAL
  Filled 2015-11-14: qty 120

## 2015-11-14 MED ORDER — OXYCODONE HCL 5 MG PO TABS
5.0000 mg | ORAL_TABLET | ORAL | Status: DC | PRN
  Administered 2015-11-18: 5 mg via ORAL
  Filled 2015-11-14: qty 1

## 2015-11-14 MED ORDER — IBUPROFEN 600 MG PO TABS
600.0000 mg | ORAL_TABLET | Freq: Four times a day (QID) | ORAL | Status: DC
  Administered 2015-11-14 – 2015-11-18 (×17): 600 mg via ORAL
  Filled 2015-11-14 (×17): qty 1

## 2015-11-14 MED ORDER — LABETALOL HCL 200 MG PO TABS
200.0000 mg | ORAL_TABLET | Freq: Two times a day (BID) | ORAL | Status: DC
  Administered 2015-11-14 – 2015-11-15 (×3): 200 mg via ORAL
  Filled 2015-11-14 (×3): qty 1

## 2015-11-14 MED ORDER — LACTATED RINGERS IV SOLN
INTRAVENOUS | Status: DC
  Administered 2015-11-14: 01:00:00 via INTRAVENOUS

## 2015-11-14 MED ORDER — HYDRALAZINE HCL 20 MG/ML IJ SOLN
20.0000 mg | Freq: Once | INTRAMUSCULAR | Status: AC
  Administered 2015-11-14: 20 mg via INTRAVENOUS
  Filled 2015-11-14: qty 1

## 2015-11-14 MED ORDER — LACTATED RINGERS IV SOLN
INTRAVENOUS | Status: DC
  Administered 2015-11-14: 17:00:00 via INTRAVENOUS

## 2015-11-14 MED ORDER — SODIUM CHLORIDE 0.9 % IV SOLN: Freq: Once | INTRAVENOUS | Status: DC | PRN

## 2015-11-14 MED ORDER — PRENATAL MULTIVITAMIN CH
1.0000 | ORAL_TABLET | Freq: Every day | ORAL | Status: DC
  Administered 2015-11-15 – 2015-11-18 (×4): 1 via ORAL
  Filled 2015-11-14 (×6): qty 1

## 2015-11-14 MED ORDER — MORPHINE SULFATE-NACL 0.5-0.9 MG/ML-% IV SOSY: PREFILLED_SYRINGE | INTRAVENOUS | Status: DC | PRN

## 2015-11-14 MED ORDER — OXYCODONE HCL 5 MG PO TABS
10.0000 mg | ORAL_TABLET | ORAL | Status: DC | PRN
  Administered 2015-11-15 – 2015-11-18 (×12): 10 mg via ORAL
  Filled 2015-11-14 (×14): qty 2

## 2015-11-14 MED ORDER — ACETAMINOPHEN 325 MG PO TABS
650.0000 mg | ORAL_TABLET | ORAL | Status: DC | PRN
  Administered 2015-11-17 – 2015-11-18 (×2): 650 mg via ORAL
  Filled 2015-11-14 (×2): qty 2

## 2015-11-14 MED ORDER — PHENYLEPHRINE 8 MG IN D5W 100 ML (0.08MG/ML) PREMIX OPTIME
INJECTION | INTRAVENOUS | Status: AC
  Filled 2015-11-14: qty 100

## 2015-11-14 MED ORDER — NALBUPHINE HCL 10 MG/ML IJ SOLN: 5.0000 mg | Freq: Once | INTRAMUSCULAR | Status: DC | PRN

## 2015-11-14 MED ORDER — MORPHINE SULFATE-NACL 0.5-0.9 MG/ML-% IV SOSY
PREFILLED_SYRINGE | INTRAVENOUS | Status: DC | PRN
  Administered 2015-11-14: .2 mg via INTRATHECAL

## 2015-11-14 MED ORDER — DIPHENHYDRAMINE HCL 50 MG/ML IJ SOLN: 12.5000 mg | INTRAMUSCULAR | Status: DC | PRN

## 2015-11-14 MED ORDER — LIDOCAINE HCL (PF) 1 % IJ SOLN: 30.0000 mL | INTRAMUSCULAR | Status: DC | PRN

## 2015-11-14 MED ORDER — DIPHENHYDRAMINE HCL 25 MG PO CAPS
25.0000 mg | ORAL_CAPSULE | ORAL | Status: DC | PRN
  Filled 2015-11-14: qty 1

## 2015-11-14 MED ORDER — OXYTOCIN 10 UNIT/ML IJ SOLN
INTRAMUSCULAR | Status: AC
  Filled 2015-11-14: qty 4

## 2015-11-14 MED ORDER — SOD CITRATE-CITRIC ACID 500-334 MG/5ML PO SOLN
30.0000 mL | ORAL | Status: DC | PRN
  Administered 2015-11-14: 30 mL via ORAL
  Filled 2015-11-14: qty 15

## 2015-11-14 MED ORDER — MISOPROSTOL 25 MCG QUARTER TABLET: 25.0000 ug | ORAL_TABLET | ORAL | Status: DC | PRN

## 2015-11-14 MED ORDER — FENTANYL CITRATE (PF) 100 MCG/2ML IJ SOLN
INTRAMUSCULAR | Status: DC | PRN
  Administered 2015-11-14: 10 ug via INTRATHECAL

## 2015-11-14 MED ORDER — OXYCODONE-ACETAMINOPHEN 5-325 MG PO TABS: 1.0000 | ORAL_TABLET | ORAL | Status: DC | PRN

## 2015-11-14 MED ORDER — DIBUCAINE 1 % RE OINT: 1.0000 "application " | TOPICAL_OINTMENT | RECTAL | Status: DC | PRN

## 2015-11-14 MED ORDER — BUPIVACAINE IN DEXTROSE 0.75-8.25 % IT SOLN
INTRATHECAL | Status: DC | PRN
  Administered 2015-11-14: 1.6 mL via INTRATHECAL

## 2015-11-14 MED ORDER — DIPHENHYDRAMINE HCL 50 MG/ML IJ SOLN
12.5000 mg | Freq: Four times a day (QID) | INTRAMUSCULAR | Status: DC | PRN
  Administered 2015-11-14: 12.5 mg via INTRAVENOUS
  Filled 2015-11-14: qty 1

## 2015-11-14 MED ORDER — SCOPOLAMINE 1 MG/3DAYS TD PT72
MEDICATED_PATCH | TRANSDERMAL | Status: AC
  Filled 2015-11-14: qty 1

## 2015-11-14 MED ORDER — OXYCODONE-ACETAMINOPHEN 5-325 MG PO TABS: 2.0000 | ORAL_TABLET | ORAL | Status: DC | PRN

## 2015-11-14 MED ORDER — MAGNESIUM SULFATE BOLUS VIA INFUSION
4.0000 g | Freq: Once | INTRAVENOUS | Status: AC
  Administered 2015-11-14: 4 g via INTRAVENOUS
  Filled 2015-11-14: qty 500

## 2015-11-14 MED ORDER — BUTORPHANOL TARTRATE 1 MG/ML IJ SOLN: 1.0000 mg | INTRAMUSCULAR | Status: DC | PRN

## 2015-11-14 MED ORDER — TETANUS-DIPHTH-ACELL PERTUSSIS 5-2.5-18.5 LF-MCG/0.5 IM SUSP: 0.5000 mL | Freq: Once | INTRAMUSCULAR | Status: DC

## 2015-11-14 MED ORDER — MEPERIDINE HCL 25 MG/ML IJ SOLN
6.2500 mg | INTRAMUSCULAR | Status: DC | PRN
  Administered 2015-11-14: 6.25 mg via INTRAVENOUS

## 2015-11-14 MED ORDER — MEPERIDINE HCL 25 MG/ML IJ SOLN
INTRAMUSCULAR | Status: AC
  Filled 2015-11-14: qty 1

## 2015-11-14 MED ORDER — NALBUPHINE HCL 10 MG/ML IJ SOLN: 5.0000 mg | INTRAMUSCULAR | Status: DC | PRN

## 2015-11-14 MED ORDER — NALOXONE HCL 0.4 MG/ML IJ SOLN: 0.4000 mg | INTRAMUSCULAR | Status: DC | PRN

## 2015-11-14 MED ORDER — NALOXONE HCL 2 MG/2ML IJ SOSY
1.0000 ug/kg/h | PREFILLED_SYRINGE | INTRAVENOUS | Status: DC | PRN
  Filled 2015-11-14: qty 2

## 2015-11-14 MED ORDER — LACTATED RINGERS IV SOLN
INTRAVENOUS | Status: DC | PRN
  Administered 2015-11-14: 11:00:00 via INTRAVENOUS

## 2015-11-14 MED ORDER — SENNOSIDES-DOCUSATE SODIUM 8.6-50 MG PO TABS
2.0000 | ORAL_TABLET | ORAL | Status: DC
  Administered 2015-11-15 – 2015-11-17 (×4): 2 via ORAL
  Filled 2015-11-14 (×6): qty 2

## 2015-11-14 MED ORDER — OXYTOCIN 40 UNITS IN LACTATED RINGERS INFUSION - SIMPLE MED: 2.5000 [IU]/h | INTRAVENOUS | Status: AC

## 2015-11-14 MED ORDER — TERBUTALINE SULFATE 1 MG/ML IJ SOLN: 0.2500 mg | Freq: Once | INTRAMUSCULAR | Status: DC | PRN

## 2015-11-14 MED ORDER — PHENYLEPHRINE 40 MCG/ML (10ML) SYRINGE FOR IV PUSH (FOR BLOOD PRESSURE SUPPORT)
PREFILLED_SYRINGE | INTRAVENOUS | Status: AC
  Filled 2015-11-14: qty 10

## 2015-11-14 MED ORDER — SIMETHICONE 80 MG PO CHEW
80.0000 mg | CHEWABLE_TABLET | ORAL | Status: DC | PRN
  Filled 2015-11-14: qty 1

## 2015-11-14 MED ORDER — FENTANYL CITRATE (PF) 100 MCG/2ML IJ SOLN
INTRAMUSCULAR | Status: AC
  Filled 2015-11-14: qty 2

## 2015-11-14 MED ORDER — ACETAMINOPHEN 325 MG PO TABS
650.0000 mg | ORAL_TABLET | ORAL | Status: DC | PRN
  Administered 2015-11-14: 650 mg via ORAL
  Filled 2015-11-14: qty 2

## 2015-11-14 MED ORDER — ONDANSETRON HCL 4 MG/2ML IJ SOLN: 4.0000 mg | Freq: Three times a day (TID) | INTRAMUSCULAR | Status: DC | PRN

## 2015-11-14 MED ORDER — ONDANSETRON HCL 4 MG/2ML IJ SOLN
INTRAMUSCULAR | Status: AC
  Filled 2015-11-14: qty 2

## 2015-11-14 MED ORDER — FENTANYL CITRATE (PF) 100 MCG/2ML IJ SOLN: 25.0000 ug | INTRAMUSCULAR | Status: DC | PRN

## 2015-11-14 MED ORDER — MAGNESIUM SULFATE 50 % IJ SOLN
1.0000 g/h | INTRAVENOUS | Status: AC
  Administered 2015-11-14: 1 g/h via INTRAVENOUS
  Filled 2015-11-14: qty 80

## 2015-11-14 MED ORDER — WITCH HAZEL-GLYCERIN EX PADS: 1.0000 "application " | MEDICATED_PAD | CUTANEOUS | Status: DC | PRN

## 2015-11-14 MED ORDER — SODIUM CHLORIDE 0.9% FLUSH: 3.0000 mL | INTRAVENOUS | Status: DC | PRN

## 2015-11-14 MED ORDER — ONDANSETRON HCL 4 MG/2ML IJ SOLN
INTRAMUSCULAR | Status: DC | PRN
  Administered 2015-11-14: 4 mg via INTRAVENOUS

## 2015-11-14 MED ORDER — PHENYLEPHRINE 8 MG IN D5W 100 ML (0.08MG/ML) PREMIX OPTIME
INJECTION | INTRAVENOUS | Status: DC | PRN
  Administered 2015-11-14: 30 ug/min via INTRAVENOUS

## 2015-11-14 MED ORDER — HYDRALAZINE HCL 20 MG/ML IJ SOLN: 10.0000 mg | Freq: Once | INTRAMUSCULAR | Status: DC | PRN

## 2015-11-14 MED ORDER — ACETAMINOPHEN 500 MG PO TABS: 1000.0000 mg | ORAL_TABLET | Freq: Four times a day (QID) | ORAL | Status: DC

## 2015-11-14 MED ORDER — SODIUM CHLORIDE 0.9 % IR SOLN
Status: DC | PRN
  Administered 2015-11-14: 1000 mL

## 2015-11-14 MED ORDER — LABETALOL HCL 5 MG/ML IV SOLN
20.0000 mg | INTRAVENOUS | Status: AC | PRN
  Administered 2015-11-14: 80 mg via INTRAVENOUS
  Administered 2015-11-14: 40 mg via INTRAVENOUS
  Administered 2015-11-14: 20 mg via INTRAVENOUS
  Filled 2015-11-14: qty 16
  Filled 2015-11-14: qty 12

## 2015-11-14 MED ORDER — PHENYLEPHRINE HCL 10 MG/ML IJ SOLN
INTRAMUSCULAR | Status: DC | PRN
  Administered 2015-11-14: 120 ug via INTRAVENOUS
  Administered 2015-11-14: 20 ug via INTRAVENOUS

## 2015-11-14 MED ORDER — SIMETHICONE 80 MG PO CHEW
80.0000 mg | CHEWABLE_TABLET | ORAL | Status: DC
  Administered 2015-11-15 – 2015-11-17 (×4): 80 mg via ORAL
  Filled 2015-11-14 (×6): qty 1

## 2015-11-14 MED ORDER — OXYTOCIN 10 UNIT/ML IJ SOLN
INTRAVENOUS | Status: DC | PRN
  Administered 2015-11-14: 40 [IU] via INTRAVENOUS

## 2015-11-14 SURGICAL SUPPLY — 41 items
BENZOIN TINCTURE PRP APPL 2/3 (GAUZE/BANDAGES/DRESSINGS) ×3 IMPLANT
CHLORAPREP W/TINT 26ML (MISCELLANEOUS) ×3 IMPLANT
CLAMP CORD UMBIL (MISCELLANEOUS) IMPLANT
CLOSURE STERI STRIP 1/2 X4 (GAUZE/BANDAGES/DRESSINGS) ×3 IMPLANT
CLOSURE WOUND 1/2 X4 (GAUZE/BANDAGES/DRESSINGS)
CLOTH BEACON ORANGE TIMEOUT ST (SAFETY) ×3 IMPLANT
DRAPE C SECTION CLR SCREEN (DRAPES) ×3 IMPLANT
DRSG OPSITE POSTOP 4X10 (GAUZE/BANDAGES/DRESSINGS) ×3 IMPLANT
ELECT REM PT RETURN 9FT ADLT (ELECTROSURGICAL) ×3
ELECTRODE REM PT RTRN 9FT ADLT (ELECTROSURGICAL) ×1 IMPLANT
EXTRACTOR VACUUM KIWI (MISCELLANEOUS) IMPLANT
GLOVE BIO SURGEON STRL SZ 6.5 (GLOVE) ×2 IMPLANT
GLOVE BIO SURGEONS STRL SZ 6.5 (GLOVE) ×1
GLOVE BIOGEL PI IND STRL 7.0 (GLOVE) ×1 IMPLANT
GLOVE BIOGEL PI INDICATOR 7.0 (GLOVE) ×2
GOWN STRL REUS W/TWL LRG LVL3 (GOWN DISPOSABLE) ×6 IMPLANT
KIT ABG SYR 3ML LUER SLIP (SYRINGE) IMPLANT
NEEDLE HYPO 25X5/8 SAFETYGLIDE (NEEDLE) ×3 IMPLANT
NS IRRIG 1000ML POUR BTL (IV SOLUTION) ×3 IMPLANT
PACK C SECTION WH (CUSTOM PROCEDURE TRAY) ×3 IMPLANT
PAD ABD 8X7 1/2 STERILE (GAUZE/BANDAGES/DRESSINGS) ×3 IMPLANT
PAD OB MATERNITY 4.3X12.25 (PERSONAL CARE ITEMS) ×3 IMPLANT
PENCIL SMOKE EVAC W/HOLSTER (ELECTROSURGICAL) ×3 IMPLANT
RETRACTOR WND ALEXIS 25 LRG (MISCELLANEOUS) ×2 IMPLANT
RTRCTR C-SECT PINK 25CM LRG (MISCELLANEOUS) IMPLANT
RTRCTR WOUND ALEXIS 25CM LRG (MISCELLANEOUS) ×6
SPONGE DRAIN TRACH 4X4 STRL 2S (GAUZE/BANDAGES/DRESSINGS) ×6 IMPLANT
STRIP CLOSURE SKIN 1/2X4 (GAUZE/BANDAGES/DRESSINGS) IMPLANT
SUT CHROMIC 1 CTX 36 (SUTURE) ×6 IMPLANT
SUT PLAIN 0 NONE (SUTURE) IMPLANT
SUT PLAIN 2 0 XLH (SUTURE) ×3 IMPLANT
SUT VIC AB 0 CT1 27 (SUTURE) ×4
SUT VIC AB 0 CT1 27XBRD ANBCTR (SUTURE) ×2 IMPLANT
SUT VIC AB 2-0 CT1 27 (SUTURE) ×2
SUT VIC AB 2-0 CT1 TAPERPNT 27 (SUTURE) ×1 IMPLANT
SUT VIC AB 3-0 CT1 27 (SUTURE)
SUT VIC AB 3-0 CT1 TAPERPNT 27 (SUTURE) IMPLANT
SUT VIC AB 4-0 KS 27 (SUTURE) ×3 IMPLANT
SYR KIT LINE DRAW 1CC W/FILTR (LINER) ×3 IMPLANT
TOWEL OR 17X24 6PK STRL BLUE (TOWEL DISPOSABLE) ×3 IMPLANT
TRAY FOLEY CATH SILVER 14FR (SET/KITS/TRAYS/PACK) ×3 IMPLANT

## 2015-11-14 NOTE — Progress Notes (Signed)
Patient ID: Donna Alvarado, female   DOB: 08/12/1985, 30 y.o.   MRN: [id number]  Cooks foley catheter placed with 60u/40v Pt tolerated well; will start pit at 2mus and keep there till foley out Pt has received two doses of labetalol at 20 and 40mus BP down to 179/90 Will recheck in 10more mins and continue per labetalol protocol Tylenol for headache

## 2015-11-14 NOTE — Op Note (Signed)
Operative Note    Preoperative Diagnosis: 1. Cat 2-3 tracing                                             2. Remote from delivery                                             3. Prior c/s                                             4. Preecclampsia   Postoperative Diagnosis: Same                                               5. Intrapeitoneal adhesions   Procedure: Repeat Low transverse c/s   Surgeon: Dr Mindi Slicker DO  Anesthesia: Spinal  Fluids: LR EBL: UOP:   Findings: Grossly nl uterus, tubes and ovaries with thin lower uterine segment, mild-moderate intraperitoneal adhesions Viable female infant in cephalic presentation Extra digit on left hand ? Clubbed foot  Specimen: placenta to path   Procedure Note  Patient was taken to the operating room where spinal anesthesia was administered. FHTs confirmed in 120s.  She was prepped and draped in the normal sterile fashion in the dorsal supine position with a leftward tilt. An appropriate time out was performed. Allis clamp test confirmed adequate anesthesia.  A Pfannenstiel skin incision was then made with the scalpel through her previous incision and carried through to the underlying layer of fascia by sharp dissection and Bovie cautery. The fascia was nicked in the midline and the incision was extended laterally with Mayo scissors. The superior and inferior aspects of the incision were grasped with Coker clamps and dissected off the underlying rectus muscles.Rectus muscles were separated in the midline and the peritoneal cavity entered bluntly. Mild to moderate intraperitoneal adhesions were sharply and bluntly dissected away. A bladder flap was created. The Alexis self-retaining wound retractor was then placed within the incision and the lower uterine segment exposed. The thin lower uterine segment was then incised in a transverse fashion and the cavity itself entered bluntly. The infants head was lifted, a loose  nuchal x 1 reduced and the baby was delivered from the incision without difficulty. The remainder of the infant also delivered easily and the nose and mouth bulb suctioned with the cord clamped and cut after 1 minute delay. The infant was handed off to the waiting pediatricians. Cord gases and ph were obtained. The placenta was then spontaneously expressed from the uterus and the uterus cleared of all clots and debris with moist lap sponge. The uterine incision was then repaired in a  running locked layer using 0 chromic - a second layer was imbricated using the same suture. The tubes and ovaries were inspected and the gutters cleared of all clots and debris and irrigated. The uterine incision was inspected again and found to be hemostatic. All instruments and sponges as well as the Alexis retractor were then removed from the  abdomen. The peritoneum was then reapproximated in a purse string fashion and the rectus muscle in a loose figure 8 with 2-0 Vicryl. The fascia was then closed with 0 Vicryl in a running fashion. Subcutaneous tissue was reapproximated with 2-0 plain in a running fashion. The skin was closed with a subcuticular stitch of 4-0 Vicryl on a Keith needle and then reinforced with a honeycomb and pressure dressing. At the conclusion of the procedure all instruments and sponge counts were correct. Patient was taken to the recovery room in good condition with her baby accompanying her skin to skin.

## 2015-11-14 NOTE — Progress Notes (Signed)
Patient ID: Donna MoleMichelle Eddins Lee, female   DOB: 12/30/1985, 30 y.o.   MRN: [id number]  Will plan on foley bulb with low dose pitocin rather than cytotec for iol

## 2015-11-14 NOTE — Anesthesia Preprocedure Evaluation (Signed)
Anesthesia Evaluation  Patient identified by MRN, date of birth, ID band Patient awake    Reviewed: Allergy & Precautions, NPO status , Patient's Chart, lab work & pertinent test results  Airway Mallampati: II  TM Distance: >3 FB Neck ROM: Full    Dental  (+) Teeth Intact, Dental Advisory Given   Pulmonary former smoker,    Pulmonary exam normal breath sounds clear to auscultation       Cardiovascular hypertension, Pt. on medications Normal cardiovascular exam Rhythm:Regular Rate:Normal     Neuro/Psych  Headaches, PSYCHIATRIC DISORDERS Anxiety Depression    GI/Hepatic negative GI ROS, Neg liver ROS,   Endo/Other  Obesity  Renal/GU negative Renal ROS     Musculoskeletal negative musculoskeletal ROS (+)   Abdominal   Peds  Hematology  (+) Blood dyscrasia, anemia , Plt 180k   Anesthesia Other Findings Day of surgery medications reviewed with the patient.  Reproductive/Obstetrics (+) Pregnancy Pre-eclampsia, magnesium therapy                             Anesthesia Physical Anesthesia Plan  ASA: III  Anesthesia Plan: Spinal   Post-op Pain Management:    Induction:   Airway Management Planned:   Additional Equipment:   Intra-op Plan:   Post-operative Plan:   Informed Consent: I have reviewed the patients History and Physical, chart, labs and discussed the procedure including the risks, benefits and alternatives for the proposed anesthesia with the patient or authorized representative who has indicated his/her understanding and acceptance.   Dental advisory given  Plan Discussed with: CRNA, Anesthesiologist and Surgeon  Anesthesia Plan Comments: (Discussed risks and benefits of and differences between spinal and general. Discussed risks of spinal including headache, backache, failure, bleeding, infection, and nerve damage. Patient consents to spinal. Questions answered.  Coagulation studies and platelet count acceptable.)        Anesthesia Quick Evaluation

## 2015-11-14 NOTE — Progress Notes (Signed)
Patient ID: Donna Alvarado, female   DOB: 12/24/1985, 30 y.o.   MRN: [id number]   Pt's BPs responded well to hydralazine once started. In past hour however BP started to increase again. Pt denies headaches but reports similar chest tightness she had in last month of pregnancy. No blurry visio or scotomata Labetalol/hydralazine protocol started again. MgSO4 started for seizure prophylaxis after third dose of medication did not bring BP down.   BP now stable at 133/77 after 20mg  of hydralazine  EFM- 130, minimal variability, no decels, cat 2 TOCO - irreg contractions q 1-256mins SVE - 2/75/-3 AROM performed with small amount of clear fluid  A/P: Will start on labetalol po 200mg  bid now          Will monitor bp and strip closely         Remote from delivery - low dose pitocin          Pt counseled about indications for c/s; current mgmt plan           Nitrous oxide for pain control

## 2015-11-14 NOTE — Transfer of Care (Signed)
Immediate Anesthesia Transfer of Care Note  Patient: Donna Alvarado  Procedure(s) Performed: Procedure(s): CESAREAN SECTION (N/A)  Patient Location: PACU  Anesthesia Type:Spinal  Level of Consciousness: awake, alert , oriented and patient cooperative  Airway & Oxygen Therapy: Patient Spontanous Breathing  Post-op Assessment: Report given to RN and Post -op Vital signs reviewed and stable  Post vital signs: Reviewed and stable  Last Vitals:  Vitals:   11/14/15 1035 11/14/15 1101  BP: (!) 160/102 (!) 148/87  Pulse: 96 (!) 102  Resp: 20 17  Temp:  36.4 C    Last Pain:  Vitals:   11/14/15 1101  TempSrc: Oral  PainSc: 7          Complications: No apparent anesthesia complications

## 2015-11-14 NOTE — Progress Notes (Signed)
Patient ID: Donna MoleMichelle Barton Lee, female   DOB: 11/26/1985, 30 y.o.   MRN: [id number] On arrival pt with BP 185/110 Reports persistent headache.   Will start on labetalol protocol for BP mgmt  If indicated will need MgSO4 Pt counselled

## 2015-11-14 NOTE — Progress Notes (Signed)
Patient ID: Donna Alvarado, female   DOB: 11/05/1985, 30 y.o.   MRN: [id number] Pt is appreciating more contractions but cervix with minimal change.  Minimal to absent variability noted  - possibly due to both stadol and MgSO4 however not much resolution apparent.  Discussed options in light of TOLAC and remote from delivery ( BP increasing)  Advice pt may be best to proceed with repeat c/s Pt verbalizes consent  Staff and anesthesia notified.  Will plan for repeat c/s

## 2015-11-14 NOTE — Anesthesia Procedure Notes (Signed)
Spinal  Patient location during procedure: OR Staffing Anesthesiologist: Cecile HearingURK, Donna Alvarado Performed: anesthesiologist  Preanesthetic Checklist Completed: patient identified, surgical consent, pre-op evaluation, timeout performed, IV checked, risks and benefits discussed and monitors and equipment checked Spinal Block Patient position: sitting Prep: site prepped and draped and DuraPrep Patient monitoring: continuous pulse ox and blood pressure Approach: midline Location: L3-4 Injection technique: single-shot Needle Needle type: Pencan  Needle gauge: 24 G Assessment Sensory level: T4 Additional Notes Functioning IV was confirmed and monitors were applied. Sterile prep and drape, including hand hygiene, mask and sterile gloves were used. The patient was positioned and the spine was prepped. The skin was anesthetized with lidocaine.  Free flow of clear CSF was obtained prior to injecting local anesthetic into the CSF.  The spinal needle aspirated freely following injection.  The needle was carefully withdrawn.  The patient tolerated the procedure well. Consent was obtained prior to procedure with all questions answered and concerns addressed. Risks including but not limited to bleeding, infection, nerve damage, paralysis, failed block, inadequate analgesia, allergic reaction, high spinal, itching and headache were discussed and the patient wished to proceed.   Donna AranStephen Turk, MD

## 2015-11-14 NOTE — Progress Notes (Signed)
Dr.Turk notified of possible C/S for Pre-eclampisia and NRFHR. Order placed for stat cbc. Prepping patient for c/s.

## 2015-11-14 NOTE — Anesthesia Postprocedure Evaluation (Signed)
Anesthesia Post Note  Patient: Donna Alvarado  Procedure(s) Performed: Procedure(s) (LRB): CESAREAN SECTION (N/A)  Patient location during evaluation: PACU Anesthesia Type: Spinal Level of consciousness: oriented and awake and alert Pain management: pain level controlled Vital Signs Assessment: post-procedure vital signs reviewed and stable Respiratory status: spontaneous breathing, respiratory function stable and patient connected to nasal cannula oxygen Cardiovascular status: blood pressure returned to baseline and stable Postop Assessment: no headache, no backache, no signs of nausea or vomiting, patient able to bend at knees and spinal receding Anesthetic complications: no     Last Vitals:  Vitals:   11/14/15 1343 11/14/15 1347  BP:  127/87  Pulse: 70 60  Resp: (!) 25 17  Temp:      Last Pain:  Vitals:   11/14/15 1347  TempSrc:   PainSc: Asleep   Pain Goal:                 Cecile HearingStephen Edward Lanyiah Brix

## 2015-11-15 ENCOUNTER — Encounter (HOSPITAL_COMMUNITY): Payer: Self-pay | Admitting: Obstetrics and Gynecology

## 2015-11-15 LAB — CBC
HEMATOCRIT: 26.6 % — AB (ref 36.0–46.0)
Hemoglobin: 8.9 g/dL — ABNORMAL LOW (ref 12.0–15.0)
MCH: 26.8 pg (ref 26.0–34.0)
MCHC: 33.5 g/dL (ref 30.0–36.0)
MCV: 80.1 fL (ref 78.0–100.0)
PLATELETS: 161 10*3/uL (ref 150–400)
RBC: 3.32 MIL/uL — AB (ref 3.87–5.11)
RDW: 14.5 % (ref 11.5–15.5)
WBC: 10.8 10*3/uL — AB (ref 4.0–10.5)

## 2015-11-15 MED ORDER — SERTRALINE HCL 50 MG PO TABS
50.0000 mg | ORAL_TABLET | Freq: Two times a day (BID) | ORAL | Status: DC
  Administered 2015-11-15 – 2015-11-18 (×6): 50 mg via ORAL
  Filled 2015-11-15 (×9): qty 1

## 2015-11-15 NOTE — Lactation Note (Signed)
This note was copied from a baby's chart. Lactation Consultation Note  Patient Name: Girl Waynetta SandyMichelle Lee ZOXWR'UToday's Date: 11/15/2015 Reason for consult: Initial assessment   Initial assessment with mom of 31 hour old infant. Infant has been formula feeding via bottle. Mom on MgSO4. Infant on double phototherapy die to + DAT and hyperbilirubainemia. Mom reports she used a NS with her 30 yo for 2 weeks and that she quit BF at 2 weeks when she was readmitted for BP problems.  Mom reports she has been attempting to latch infant to breast without success. Mom reports she has pumped x 2 without any results.  Mom reports she has hand expressed without success. Discussed supply and demand and enc mom to BF infant for each feeding using # 20 NS, Pump post BF for 15 minutes on Initiate setting every 2-3 hours, and follow by hand expression. Reviewed milk coming to volume on day 3-5.  Infant was cueing to feed, attempted to latch infant to the left breast in the cross cradle hold, Breasts are large and compressible. Areola semi compressible and nipples flatten with areolar compression. Unable to hand express colostrum. Used # 24 and # 20 NS, infant did not form a seal with either. She is noted to click throughout feeding at the breast and with the bottle. When sucking on gloved finger infant clicks and does not form a seal. She is noted to hump her tongue in the back. She is able to transfer milk via the bottle although she clicks loudly. Showed dad and mom how to use chin and cheek support and clicking diminished significantly.   BF Resources handout and LC Brochure given, IP/OP Services, BF Support Groups and LC phone # info given. Follow up tomorrow and prn. Report to Selena BattenKim, Charity fundraiserN.  Plan:  Attempt to BF 8-12 x in 24 hours at first feeding cues using # 20 NS Pump after BF for 15 minutes on Initiiate setting every 2-3 hours Hand express post pumping Feed all EBM to infant first followed by formula as needed.   Follow up tomorrwo and prn   Maternal Data Formula Feeding for Exclusion: No Has patient been taught Hand Expression?: Yes Does the patient have breastfeeding experience prior to this delivery?: Yes  Feeding Feeding Type: Breast Fed Nipple Type: Slow - flow Length of feed: 2 min  LATCH Score/Interventions Latch: Repeated attempts needed to sustain latch, nipple held in mouth throughout feeding, stimulation needed to elicit sucking reflex. Intervention(s): Adjust position;Assist with latch;Breast massage;Breast compression  Audible Swallowing: None  Type of Nipple: Flat (everted at rest, flattens with areolar compression) Intervention(s): Double electric pump  Comfort (Breast/Nipple): Soft / non-tender     Hold (Positioning): Assistance needed to correctly position infant at breast and maintain latch. Intervention(s): Breastfeeding basics reviewed;Support Pillows;Position options;Skin to skin  LATCH Score: 5  Lactation Tools Discussed/Used Tools: Nipple Shields Nipple shield size: 20;24 WIC Program: Yes Pump Review: Setup, frequency, and cleaning   Consult Status Consult Status: Follow-up Date: 11/16/15 Follow-up type: In-patient    Silas FloodSharon S Sapphire Tygart 11/15/2015, 8:04 AM

## 2015-11-15 NOTE — Progress Notes (Signed)
Csw is aware of consult. This writer met with MOB at bedside in attempt to complete assessment. At this time, MOB noted she was in great pain and on pain medication thus asked to be seen by a CSW tomorrow.   Jenita Rayfield, MSW, LCSW-A Clinical Social Worker  [facility]  Office: [phone]   

## 2015-11-15 NOTE — Progress Notes (Signed)
Patient ID: Donna MoleMichelle Dada Alvarado, female   DOB: 06/07/1985, 30 y.o.   MRN: [id number] POD#1  Pt doing well. No headaches or blurry vision. Feels foggy due to MgSO4 as expected. Tolerating PO. Pain well controlled with meds. Bonding well with baby - baby under double bili lights; breast/bottle feeding VSS - 141/84 ( 140-157/80-84) ABD - soft, pressure dressing in place; no drainage but dried old blood on honeycomb EXT - +1 edema b/l; no homans   Hg -8.9; plts - 161 Nl LFTs  A/P: POD#1 s/p repeat c/s (for cat 3 strip; abort VBAC)        Stop MgSO4 at 1145am today (24hrs )        Continue on labetalol 200mg  po bid        Start on zoloft 50mg  po bid - hx pp depression        Change honeycomb dressing        Routine pp/post op care

## 2015-11-16 LAB — CBC
HEMATOCRIT: 24 % — AB (ref 36.0–46.0)
Hemoglobin: 8.1 g/dL — ABNORMAL LOW (ref 12.0–15.0)
MCH: 27.2 pg (ref 26.0–34.0)
MCHC: 33.8 g/dL (ref 30.0–36.0)
MCV: 80.5 fL (ref 78.0–100.0)
Platelets: 140 10*3/uL — ABNORMAL LOW (ref 150–400)
RBC: 2.98 MIL/uL — AB (ref 3.87–5.11)
RDW: 15.2 % (ref 11.5–15.5)
WBC: 9 10*3/uL (ref 4.0–10.5)

## 2015-11-16 LAB — COMPREHENSIVE METABOLIC PANEL
ALBUMIN: 2 g/dL — AB (ref 3.5–5.0)
ALT: 21 U/L (ref 14–54)
AST: 23 U/L (ref 15–41)
Alkaline Phosphatase: 119 U/L (ref 38–126)
Anion gap: 7 (ref 5–15)
BILIRUBIN TOTAL: 0.3 mg/dL (ref 0.3–1.2)
BUN: 19 mg/dL (ref 6–20)
CHLORIDE: 105 mmol/L (ref 101–111)
CO2: 22 mmol/L (ref 22–32)
CREATININE: 1.34 mg/dL — AB (ref 0.44–1.00)
Calcium: 7.9 mg/dL — ABNORMAL LOW (ref 8.9–10.3)
GFR calc Af Amer: >60 mL/min (ref >60)
GFR, EST NON AFRICAN AMERICAN: 52 mL/min — AB (ref >60)
GLUCOSE: 85 mg/dL (ref 65–99)
POTASSIUM: 4.5 mmol/L (ref 3.5–5.1)
Sodium: 134 mmol/L — ABNORMAL LOW (ref 135–145)
TOTAL PROTEIN: 5.7 g/dL — AB (ref 6.5–8.1)

## 2015-11-16 MED ORDER — FUROSEMIDE 10 MG/ML IJ SOLN
20.0000 mg | Freq: Once | INTRAMUSCULAR | Status: AC
  Administered 2015-11-16: 20 mg via INTRAVENOUS
  Filled 2015-11-16: qty 2

## 2015-11-16 MED ORDER — NIFEDIPINE ER OSMOTIC RELEASE 30 MG PO TB24
60.0000 mg | ORAL_TABLET | Freq: Every day | ORAL | Status: DC
  Administered 2015-11-16 – 2015-11-17 (×2): 60 mg via ORAL
  Filled 2015-11-16 (×2): qty 2

## 2015-11-16 MED ORDER — LABETALOL HCL 200 MG PO TABS
200.0000 mg | ORAL_TABLET | Freq: Three times a day (TID) | ORAL | Status: DC
  Administered 2015-11-16 (×2): 200 mg via ORAL
  Filled 2015-11-16 (×3): qty 1

## 2015-11-16 NOTE — Progress Notes (Signed)
CSW provided MOB with counseling resources, new mother mental health checklist, Healthy Start information and phone number to [facility] Crisis line per conversation during psychosocial assessment. 

## 2015-11-16 NOTE — Discharge Summary (Addendum)
OB Discharge Summary     Patient Name: Donna Alvarado DOB: 01-17-1986 MRN: [id number]  Date of admission: 11/14/2015 Delivering MD: Edwinna Areola   Date of discharge:   Admitting diagnosis: induction Intrauterine pregnancy: [redacted]w[redacted]d     Secondary diagnosis:  Active Problems:   Preeclampsia, third trimester   Non-reassuring electronic fetal monitoring tracing   Postpartum care following cesarean delivery   Discharge diagnosis: Term Pregnancy Delivered                                                                                                Post partum procedures:Postpartum magnesium                                             Blood pressure management  Augmentation: AROM, Pitocin and Foley Balloon  Complications: None  Hospital course:  Induction of Labor With Cesarean Section  30 y.o. yo [id number] at [redacted]w[redacted]d was admitted to the hospital 11/14/2015 for induction of labor. Patient had a labor course significant for slow progress and persistent category 2-3 tracing. The patient went for cesarean section due to persistent category 2-3 tracing remote from delivery., and delivered a Viable infant,Membrane Rupture Time/Date: 9:29 AM ,11/14/2015   Details of operation can be found in separate operative Note.  Her postpartum course was complicated by elevated BP - controlled with Procardia 90 qd and labetalol 300 tid.. She is ambulating, tolerating a regular diet, passing flatus, and urinating well.  Patient is discharged home in stable condition on 11/18/15.                                     Physical exam  Vitals:   11/17/15 2130 11/18/15 0524 11/18/15 0959 11/18/15 1700  BP:  130/79 (!) 142/89 134/68  Pulse:  93 93 95  Resp:  18    Temp:  98.7 F (37.1 C) 98.6 F (37 C) 98.1 F (36.7 C)  TempSrc:  Oral Oral Oral  SpO2: 100% 99% 98%   Weight:  81.6 kg (180 lb)    Height:       General: alert and cooperative Lochia: appropriate Uterine Fundus: firm Incision:  Dressing is clean, dry, and intact  Labs: Lab Results  Component Value Date   WBC 7.3 11/18/2015   HGB 8.3 (L) 11/18/2015   HCT 25.0 (L) 11/18/2015   MCV 80.6 11/18/2015   PLT 168 11/18/2015   CMP Latest Ref Rng & Units 11/18/2015  Glucose 65 - 99 mg/dL 84  BUN 6 - 20 mg/dL 12  Creatinine 1.47 - 8.29 mg/dL 5.62  Sodium 130 - 865 mmol/L 135  Potassium 3.5 - 5.1 mmol/L 4.2  Chloride 101 - 111 mmol/L 105  CO2 22 - 32 mmol/L 24  Calcium 8.9 - 10.3 mg/dL 7.8(I)  Total Protein 6.5 - 8.1 g/dL 6.9(G)  Total Bilirubin 0.3 - 1.2 mg/dL 0.4  Alkaline Phos  38 - 126 U/L 111  AST 15 - 41 U/L 24  ALT 14 - 54 U/L 22    Discharge instruction: per After Visit Summary and "Baby and Me Booklet".  After visit meds:    Medication List    TAKE these medications   acetaminophen 500 MG tablet Commonly known as:  TYLENOL Take 500 mg by mouth every 4 (four) hours as needed for mild pain, moderate pain or headache.   ibuprofen 600 MG tablet Commonly known as:  ADVIL,MOTRIN Take 1 tablet (600 mg total) by mouth every 6 (six) hours.   labetalol 300 MG tablet Commonly known as:  NORMODYNE Take 1 tablet (300 mg total) by mouth every 8 (eight) hours.   NIFEdipine 90 MG 24 hr tablet Commonly known as:  PROCARDIA XL/ADALAT-CC Take 1 tablet (90 mg total) by mouth daily. Start taking on:  11/19/2015   oxyCODONE 5 MG immediate release tablet Commonly known as:  Oxy IR/ROXICODONE 1-2 po q 6 hours prn severe pain   PRENATAL GUMMIES/DHA & FA 0.4-32.5 MG Chew Chew 1 each by mouth daily.   sertraline 50 MG tablet Commonly known as:  ZOLOFT Take 1 tablet (50 mg total) by mouth 2 (two) times daily.       Diet: home with mother  Activity: Advance as tolerated. Pelvic rest for 6 weeks.   Outpatient follow up:1 week for BP check, 2 weeks for incision check and 6 weeks for full postpartum exam Follow up Appt:No future appointments. Follow up Visit:No Follow-up on file.  Postpartum  contraception: Undecided  Newborn Data: Live born female  Birth Weight: 6 lb 13.4 oz (3100 g) APGAR: 8, 9  Baby Feeding: Breast/bottle Disposition:home with mother   11/18/2015 Oliver PilaICHARDSON,KATHY W, MD

## 2015-11-16 NOTE — Progress Notes (Signed)
CLINICAL SOCIAL WORK MATERNAL/CHILD NOTE  Patient Details  Name: Donna Alvarado MRN: [id number] Date of Birth: 11/14/2016  Date:  11/16/2015  Clinical Social Worker Initiating Note:  Colleen E. Brigitte Pulse, [place] Date/ Time Initiated:  11/16/15/1300     Child's Name:  Donna Alvarado   Legal Guardian:   (Parents: Donna Alvarado and Donna Alvarado)   Need for Interpreter:  None   Date of Referral:  11/15/15     Reason for Referral:  Other (Comment) (Severe Postpartum Depression.)   Referral Source:  [facility]   Address:  [address] [place],  [place] [address]  Phone number:  [phone]   Household Members:  Spouse, Minor Children (Parents have one other child, Donna Alvarado/age 32.5)   Natural Supports (not living in the home):   (MOB reports that she has no supports locally.  She later spoke of a good friend who lives in the area.  MOB reports that she does not feel her husband is supportive and that her family lives in [place].)   Professional Supports:  (MOB states she had a therapist at [facility], but feels it will be difficult to return because she is now caring for two young children.  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    Strengths:  Ability to meet basic needs , Pediatrician chosen , Home prepared for child , Understanding of illness (Pediatric follow [facility] will [facility] at [facility].  MOB has a good understanding of her physical condition and awareness of perinatal mood disorders.)   Risk Factors/Current Problems:  Mental Health Concerns , Family/Relationship Issues , Other (Comment) (Lack of support)    Cognitive State:  Alert , Able to Concentrate , Goal Oriented , Insightful , Linear Thinking    Mood/Affect:  Flat , Calm , Interested    CSW Assessment: CSW met with MOB in her third floor room/302 to offer support and complete assessment due to history of severe postpartum depression.  MOB's 31 year old son, Donna Alvarado, was in the room, which made it very difficult to converse with MOB.  She appeared frustrated with him and CSW asked about supports to help her with her son in order for her to rest.  MOB states she has no supports, and that her husband is involved, but does not feel he is supportive.  She states her family lives in [place].  Upon further discussion, MOB reports that she has a good friend in the area, although her friend is busy with her 4 children.  She does feel she can call this friend for support as needed.   CSW discussed professional supports and MOB openly talked about her experience with PMADs after her first delivery.  She states her depressive symptoms were "really bad" and that she did not seek help for 4-5 months.  She reports that her OB has already started her on Zoloft now as she is concerned about going through this again.  CSW commends her for taking that step to cope with her emotions.  CSW asked if MOB would be interested in counseling and she  states she has been to the [facility] in the past.  She feels she could return if needed, but is concerned that time will be a barrier to treatment.  She asked for other counseling resources, which CSW later provided.   CSW asked about her coping mechanisms and MOB states she usually sleeps to cope with stress.  CSW evaluated this further and it appears to be a healthy coping mechanism rather than an avoidance technique.  CSW asked her to monitor her sleep as a change in ability to sleep can be linked to emotional distress.  CSW also asked her to monitor her appetite for the same reason.  MOB was unable to identify  other coping mechanisms.  CSW suggested relaxation techniques, journaling, outings, exercise as possible outlets for stress, anxiety and depression.   MOB was able to identify strengths such as knowing what to expect since this is her second child.  She also is aware this time that help is available should she determine that she is experiencing symptoms of PMADs again.  She states she had postpartum pre-eclampsia after her last delivery and is thankful that her doctors are aware of this to hopefully prevent serious side effects she was re-hospitalized for after her first child.  CSW assisted her in acknowledging these strengths.  MOB's husband arrived as CSW was leaving.  When CSW returned with resource information, MOB was resting and her son was no longer in the room with her.  CSW Plan/Description:  No Further Intervention Required/No Barriers to Discharge, Patient/Family Education , Information/Referral to [place], [place], [place] 11/16/2015, 4:52 PM

## 2015-11-16 NOTE — Progress Notes (Deleted)
Subjective: Postpartum Day 2: Cesarean Delivery Patient reports incisional pain and tolerating PO.    Objective: Vital signs in last 24 hours: Temp:  [97.7 F (36.5 C)-98.8 F (37.1 C)] 97.8 F (36.6 C) (10/23 0600) Pulse Rate:  [60-76] 67 (10/23 0600) Resp:  [16-18] 18 (10/23 0600) BP: (128-157)/(76-92) 152/86 (10/23 0600) SpO2:  [96 %-100 %] 99 % (10/23 0600) Weight:  [84.8 kg (187 lb 0.1 oz)] 84.8 kg (187 lb 0.1 oz) (10/23 0600)  Physical Exam:  General: alert and cooperative Lochia: appropriate Uterine Fundus: firm Incision: C/D/I    Recent Labs  11/14/15 1453 11/15/15 0521  HGB 10.1* 8.9*  HCT 30.3* 26.6*    Assessment/Plan: Status post Cesarean section. Doing well postoperatively.  BP stable, but may be creeping up.   Will continue and maybe increase to TID.    Oliver PilaICHARDSON,Sourish Allender W 11/16/2015, 8:29 AM

## 2015-11-16 NOTE — Progress Notes (Signed)
Subjective: Postpartum Day 2 : Cesarean Delivery Patient reports incisional pain, tolerating PO and + flatus.  No BM yet.  Was hoping for d/c today but has mild HA.  Objective: Vital signs in last 24 hours: Temp:  [97.8 F (36.6 C)-98.8 F (37.1 C)] 97.8 F (36.6 C) (10/23 0600) Pulse Rate:  [60-75] 67 (10/23 0600) Resp:  [16-18] 18 (10/23 0600) BP: (128-157)/(76-92) 152/86 (10/23 0600) SpO2:  [96 %-100 %] 99 % (10/23 0600) Weight:  [84.8 kg (187 lb 0.1 oz)] 84.8 kg (187 lb 0.1 oz) (10/23 0600)  Physical Exam:  General: alert and cooperative Lochia: appropriate Uterine Fundus: firm Incision: C/D/I    Recent Labs  11/14/15 1453 11/15/15 0521  HGB 10.1* 8.9*  HCT 30.3* 26.6*    Assessment/Plan: Status post Cesarean section. Pt with BP creeping up slightly to 150/90 range.  Will increase labetalol to TID and see how BP runs through day today. If BP stable and HA resolves, will assess for d/c this PM  Simi Briel W 11/16/2015, 9:14 AM

## 2015-11-16 NOTE — Progress Notes (Signed)
Patient ID: Donna Alvarado, female   DOB: 07/24/1985, 30 y.o.   MRN: [id number] Pt states has had a HA most of day unrelieved by percocet.  Fell asleep holding baby after had the percocet.    BP 150/90's despite increased labetalol, will change to procardia  Repeat labs ok except creatinine up to 1.34, so maybe some vasospasm  Change to procardia Trial of lasix x 1 for increased diuresis which pt feels has slowed down today

## 2015-11-16 NOTE — Lactation Note (Signed)
This note was copied from a baby's chart. Lactation Consultation Note  Patient Name: Donna Alvarado ZOXWR'UToday's Date: 11/16/2015 Reason for consult: Follow-up assessment   Follow up with mom of 50 hour old infant. Infant bottle feeding. Mom reports she has not put her to breast today and has not pumped since yesterday. Infant just finished a bottle and mom declined latch assistance at this time. Mom reports she plans to pump but has been tired. Enc her to call with questions/concerns prn.    Maternal Data    Feeding Feeding Type: Bottle Fed - Formula Nipple Type: Slow - flow  LATCH Score/Interventions                      Lactation Tools Discussed/Used Pump Review: Setup, frequency, and cleaning   Consult Status Consult Status: Follow-up Date: 11/17/15 Follow-up type: In-patient    Silas FloodSharon S Shelene Krage 11/16/2015, 2:42 PM

## 2015-11-17 LAB — COMPREHENSIVE METABOLIC PANEL
ALT: 20 U/L (ref 14–54)
ANION GAP: 6 (ref 5–15)
AST: 21 U/L (ref 15–41)
Albumin: 2 g/dL — ABNORMAL LOW (ref 3.5–5.0)
Alkaline Phosphatase: 113 U/L (ref 38–126)
BUN: 21 mg/dL — ABNORMAL HIGH (ref 6–20)
CHLORIDE: 107 mmol/L (ref 101–111)
CO2: 21 mmol/L — ABNORMAL LOW (ref 22–32)
CREATININE: 1.4 mg/dL — AB (ref 0.44–1.00)
Calcium: 8.1 mg/dL — ABNORMAL LOW (ref 8.9–10.3)
GFR, EST AFRICAN AMERICAN: 57 mL/min — AB (ref >60)
GFR, EST NON AFRICAN AMERICAN: 49 mL/min — AB (ref >60)
Glucose, Bld: 79 mg/dL (ref 65–99)
POTASSIUM: 4.2 mmol/L (ref 3.5–5.1)
Sodium: 134 mmol/L — ABNORMAL LOW (ref 135–145)
Total Bilirubin: 0.3 mg/dL (ref 0.3–1.2)
Total Protein: 5.6 g/dL — ABNORMAL LOW (ref 6.5–8.1)

## 2015-11-17 LAB — CBC
HCT: 23.4 % — ABNORMAL LOW (ref 36.0–46.0)
Hemoglobin: 7.9 g/dL — ABNORMAL LOW (ref 12.0–15.0)
MCH: 27.1 pg (ref 26.0–34.0)
MCHC: 33.8 g/dL (ref 30.0–36.0)
MCV: 80.1 fL (ref 78.0–100.0)
PLATELETS: 124 10*3/uL — AB (ref 150–400)
RBC: 2.92 MIL/uL — ABNORMAL LOW (ref 3.87–5.11)
RDW: 15.2 % (ref 11.5–15.5)
WBC: 8 10*3/uL (ref 4.0–10.5)

## 2015-11-17 MED ORDER — NIFEDIPINE ER OSMOTIC RELEASE 30 MG PO TB24
30.0000 mg | ORAL_TABLET | Freq: Once | ORAL | Status: AC
  Administered 2015-11-17: 30 mg via ORAL
  Filled 2015-11-17: qty 1

## 2015-11-17 MED ORDER — LABETALOL HCL 200 MG PO TABS
300.0000 mg | ORAL_TABLET | Freq: Three times a day (TID) | ORAL | Status: DC
  Administered 2015-11-17 – 2015-11-18 (×3): 300 mg via ORAL
  Filled 2015-11-17 (×3): qty 1

## 2015-11-17 MED ORDER — LABETALOL HCL 200 MG PO TABS
200.0000 mg | ORAL_TABLET | Freq: Three times a day (TID) | ORAL | Status: DC
  Administered 2015-11-17 (×2): 200 mg via ORAL
  Filled 2015-11-17 (×2): qty 1

## 2015-11-17 MED ORDER — LABETALOL HCL 5 MG/ML IV SOLN
40.0000 mg | Freq: Once | INTRAVENOUS | Status: AC
  Administered 2015-11-17: 40 mg via INTRAVENOUS
  Filled 2015-11-17: qty 8

## 2015-11-17 MED ORDER — LABETALOL HCL 200 MG PO TABS: 200.0000 mg | ORAL_TABLET | Freq: Three times a day (TID) | ORAL | Status: DC

## 2015-11-17 MED ORDER — NIFEDIPINE ER OSMOTIC RELEASE 30 MG PO TB24
90.0000 mg | ORAL_TABLET | Freq: Every day | ORAL | Status: DC
  Administered 2015-11-18: 90 mg via ORAL
  Filled 2015-11-17: qty 3

## 2015-11-17 NOTE — Progress Notes (Signed)
POD #3 LTCS Feeling ok, still with some pain.  We have had difficulty controlling her BP over the past 24 hours Afeb, VSS, BP 150-190/70-90 Abd- soft, fundus firm, incision intact Labs essentially stable, Plt down to 124k, Cre was 1.34 yesterday, 1.40 today, LFTs normal, Hgb stable at 7.9 BP meds have been changed over the past 24 hours, now on Labetalol 200 mg PO tid and Procardia XL 60 mg po daily.  She has received some IV Labetalol and lasix to help with BP.  Will keep her on current regimen, due for Procardia at 1000, monitor BP and urinary output, repeat labs in am, adjust BP meds as necessary.

## 2015-11-17 NOTE — Lactation Note (Signed)
This note was copied from a baby's chart. Lactation Consultation Note  Patient Name: Donna Alvarado ZOXWR'UToday's Date: 11/17/2015 Reason for consult: Follow-up assessment   Follow up with mom of 75 hour old infant. Infant has been bottle feeding, mom has not pumped. Infant asleep on foot of bed with pacifier in mouth, mother asleep in bed, awakened mother and advised not sleeping with infant in bed. Delorse Lekhris Galloway, RN notified. Mom reports she plans to BF infant around 4 pm and would like assistance. Follow up for 4 pm feeding.    Maternal Data    Feeding Feeding Type: Bottle Fed - Formula Nipple Type: Slow - flow  LATCH Score/Interventions                      Lactation Tools Discussed/Used Pump Review: Setup, frequency, and cleaning   Consult Status Consult Status: PRN Follow-up type: In-patient    Donna Alvarado 11/17/2015, 3:33 PM

## 2015-11-17 NOTE — Progress Notes (Signed)
BP still elevated, will increase labetalol to 300 mg tid and increase Procardia to 90 mg po daily, add another 30 mg now.

## 2015-11-17 NOTE — Lactation Note (Signed)
This note was copied from a baby's chart. Lactation Consultation Note  Patient Name: Donna Waynetta SandyMichelle Alvarado ZOXWR'UToday's Date: 11/17/2015 Reason for consult: Follow-up assessment   Follow up with mom for feeding. Dad feeding infant a bottle of formula. Mom reports she does not want to latch infant, She is going to pump since she is feeling full today. She denies s/s engorgement. Follow up prn. Mom without questions/concerns at this time.    Maternal Data    Feeding Feeding Type: Bottle Fed - Formula Nipple Type: Slow - flow  LATCH Score/Interventions                      Lactation Tools Discussed/Used Pump Review: Setup, frequency, and cleaning   Consult Status Consult Status: PRN Follow-up type: Call as needed    Ed BlalockSharon S Shaquile Lutze 11/17/2015, 4:18 PM

## 2015-11-18 ENCOUNTER — Encounter (HOSPITAL_COMMUNITY)
Admission: RE | Admit: 2015-11-18 | Discharge: 2015-11-18 | Disposition: A | Discharge status: Home / Self Care | Payer: Managed Care, Other (non HMO) | Source: Ambulatory Visit

## 2015-11-18 LAB — COMPREHENSIVE METABOLIC PANEL
ALT: 22 U/L (ref 14–54)
AST: 24 U/L (ref 15–41)
Albumin: 2 g/dL — ABNORMAL LOW (ref 3.5–5.0)
Alkaline Phosphatase: 111 U/L (ref 38–126)
Anion gap: 6 (ref 5–15)
BUN: 12 mg/dL (ref 6–20)
CO2: 24 mmol/L (ref 22–32)
Calcium: 8.4 mg/dL — ABNORMAL LOW (ref 8.9–10.3)
Chloride: 105 mmol/L (ref 101–111)
Creatinine, Ser: 0.91 mg/dL (ref 0.44–1.00)
Glucose, Bld: 84 mg/dL (ref 65–99)
POTASSIUM: 4.2 mmol/L (ref 3.5–5.1)
Sodium: 135 mmol/L (ref 135–145)
TOTAL PROTEIN: 5.5 g/dL — AB (ref 6.5–8.1)
Total Bilirubin: 0.4 mg/dL (ref 0.3–1.2)

## 2015-11-18 LAB — TYPE AND SCREEN
ABO/RH(D): O POS
ANTIBODY SCREEN: NEGATIVE
Unit division: 0
Unit division: 0
Unit division: 0
Unit division: 0

## 2015-11-18 LAB — CBC
HEMATOCRIT: 25 % — AB (ref 36.0–46.0)
Hemoglobin: 8.3 g/dL — ABNORMAL LOW (ref 12.0–15.0)
MCH: 26.8 pg (ref 26.0–34.0)
MCHC: 33.2 g/dL (ref 30.0–36.0)
MCV: 80.6 fL (ref 78.0–100.0)
Platelets: 168 10*3/uL (ref 150–400)
RBC: 3.1 MIL/uL — ABNORMAL LOW (ref 3.87–5.11)
RDW: 15.2 % (ref 11.5–15.5)
WBC: 7.3 10*3/uL (ref 4.0–10.5)

## 2015-11-18 MED ORDER — PRENATAL GUMMIES/DHA & FA 0.4-32.5 MG PO CHEW: 1.0000 | CHEWABLE_TABLET | Freq: Every day | ORAL | 3 refills | Status: DC

## 2015-11-18 MED ORDER — IBUPROFEN 600 MG PO TABS: 600.0000 mg | ORAL_TABLET | Freq: Four times a day (QID) | ORAL | 1 refills | Status: DC

## 2015-11-18 MED ORDER — OXYCODONE HCL 5 MG PO TABS: ORAL_TABLET | ORAL | 0 refills | Status: DC

## 2015-11-18 MED ORDER — NIFEDIPINE ER OSMOTIC RELEASE 90 MG PO TB24: 90.0000 mg | ORAL_TABLET | Freq: Every day | ORAL | 3 refills | Status: DC

## 2015-11-18 MED ORDER — SERTRALINE HCL 50 MG PO TABS: 50.0000 mg | ORAL_TABLET | Freq: Two times a day (BID) | ORAL | 3 refills | Status: DC

## 2015-11-18 MED ORDER — LABETALOL HCL 300 MG PO TABS: 300.0000 mg | ORAL_TABLET | Freq: Three times a day (TID) | ORAL | 3 refills | Status: DC

## 2015-11-18 NOTE — Progress Notes (Addendum)
Patient ID: Donna MoleMichelle Arrazola Alvarado, female   DOB: 03/01/1985, 30 y.o.   MRN: [id number]   Pt's BP much better controlled thru day.  Ambulating and pain controlled  Desires d/c to home - will d/c with procardia, labetalol and Zoloft  Will make BP recheck 1 week /Incision check appt in 2 weeks Pt to monitor BP at home.

## 2015-11-18 NOTE — Lactation Note (Signed)
This note was copied from a baby's chart. Lactation Consultation Note  Patient Name: Donna Alvarado Reason for consult: Follow-up assessment   With this mom oaf a 4 day old baby, mom still in the hospital due to hypertension.. Mom has not been able to get baby to sustain latch. She reports the sam problem with her 31 year old. On exam, the baby has a short posterior frenulum, imbedded in thick tissue, and decreases extension of the tongue. I fitted mom with a 20 nipple shiled, and  Fed the baby 10 ml's of EBm and 20 ml's of formula, into the shield, with a curved tip syringe. The baby suckled well, with a deep latch, some good breast movement, and no discomfort from mom. I then showed mom and dad how to set up and use a  use a starter SNS  to feed at next feeding, along with the nipple shield. Mom very receptive to teaching, and is going to attempt applying nipple shield, with SNS tubing taped by her nipple, and under shield. I told mom and dad to have lactation called, if they re not able to do this on their own, or to just have mom pump and bottle feed EBm and then formula, for now.    Maternal Data    Feeding Feeding Type: Breast Milk Nipple Type: Slow - flow Length of feed: 25 min  LATCH Score/Interventions Latch: Repeated attempts needed to sustain latch, nipple held in mouth throughout feeding, stimulation needed to elicit sucking reflex. Intervention(s): Adjust position;Assist with latch;Breast massage;Breast compression  Audible Swallowing: Spontaneous and intermittent (with SNS under shield)  Type of Nipple: Everted at rest and after stimulation Intervention(s): Double electric pump  Comfort (Breast/Nipple): Soft / non-tender  Problem noted: Filling  Hold (Positioning): Assistance needed to correctly position infant at breast and maintain latch. Intervention(s): Breastfeeding basics reviewed;Support Pillows;Position options (skin to skin suggested for  next feedings)  LATCH Score: 8  Lactation Tools Discussed/Used Tools: Nipple Shields Nipple shield size: 20   Consult Status Consult Status: Follow-up Date: 11/19/15 Follow-up type: In-patient    Donna Alvarado, Donna Alvarado Alvarado, 4:45 PM

## 2015-11-18 NOTE — Progress Notes (Signed)
Discharge teaching complete.  Pt ambulated out with family. Education complete and prescriptions given.

## 2015-11-18 NOTE — Progress Notes (Addendum)
Subjective: Postpartum Day 4: Cesarean Delivery Patient reports incisional pain and tolerating PO.   Mood OK, c/o HA, some blurry vision.  Will monitor sx's, encourage ambulation.    Objective: Vital signs in last 24 hours: Temp:  [97.7 F (36.5 C)-98.9 F (37.2 C)] 98.7 F (37.1 C) (10/25 0524) Pulse Rate:  [64-93] 93 (10/25 0524) Resp:  [18-20] 18 (10/25 0524) BP: (123-183)/(72-95) 130/79 (10/25 0524) SpO2:  [99 %-100 %] 99 % (10/25 0524) Weight:  [81.6 kg (180 lb)] 81.6 kg (180 lb) (10/25 0524)  Physical Exam:  General: alert and no distress Lochia: appropriate Uterine Fundus: firm Incision: healing well DVT Evaluation: No evidence of DVT seen on physical exam.   Recent Labs  11/17/15 0535 11/18/15 0559  HGB 7.9* 8.3*  HCT 23.4* 25.0*    Assessment/Plan: Status post Cesarean section. Doing well postoperatively. Complicated by elevated BP - better controlled with Procardia 90 and 300 tid Labetalol.   Possible d/c this PM - will monitor BP through day.  Hinton Rao.  Bovard-Stuckert, Mikailah Morel 11/18/2015, 7:23 AM

## 2015-11-18 NOTE — Lactation Note (Signed)
This note was copied from a baby's chart. Lactation Consultation Note  Patient Name: Girl Waynetta SandyMichelle Lloyd Cullinan WUJWJ'XToday's Date: 11/18/2015 Reason for consult: Follow-up assessment  With this mom of a term baby, now 514 days old, and formula feeding. Mom has decided to pump, and I fitted her with 21 flanges with a good fit. Mom advised to increase to 24 flanges if 21 get too tight, and milk flow decreases. Mom also decided she wants to try and latch the baby around 3 pm, with a nipple shield. Mom does have some milk  Being expressed with pumping. I will check baby at 3 pm, to assist mom with latching her baby.    Maternal Data    Feeding Feeding Type: Bottle Fed - Formula Nipple Type: Slow - flow  LATCH Score/Interventions                      Lactation Tools Discussed/Used Tools: Flanges Flange Size:  (21 flanges with good fit) Pump Review: Setup, frequency, and cleaning;Milk Storage;Other (comment) (hand expression, maintenance setting, 21 flanges)   Consult Status Consult Status: Follow-up Date: 11/19/15 Follow-up type: In-patient    Alfred LevinsLee, Coretta Leisey Anne 11/18/2015, 11:59 AM

## 2015-11-19 ENCOUNTER — Inpatient Hospital Stay (HOSPITAL_COMMUNITY)
Admission: RE | Admit: 2015-11-19 | Payer: Managed Care, Other (non HMO) | Source: Ambulatory Visit | Admitting: Obstetrics and Gynecology

## 2015-11-19 SURGERY — Surgical Case
Anesthesia: Regional

## 2016-01-24 IMAGING — US US OB TRANSVAGINAL
1 series · 14 of 28 positions shown · non-contrast
Comparison: Prior pelvic ultrasound 09/26/2012

CLINICAL DATA: 28-year-old female with heavy bleeding and cramping.
Quantitative beta HCG 5,733

EXAM:
OBSTETRIC <14 WK US AND TRANSVAGINAL OB US
TECHNIQUE: Both transabdominal and transvaginal ultrasound examinations were
performed for complete evaluation of the gestation as well as the
maternal uterus, adnexal regions, and pelvic cul-de-sac.
Transvaginal technique was performed to assess early pregnancy.

[Series 1: us ob transvaginal · 37 acquisitions, 14 frames shown]
[im 2/37]
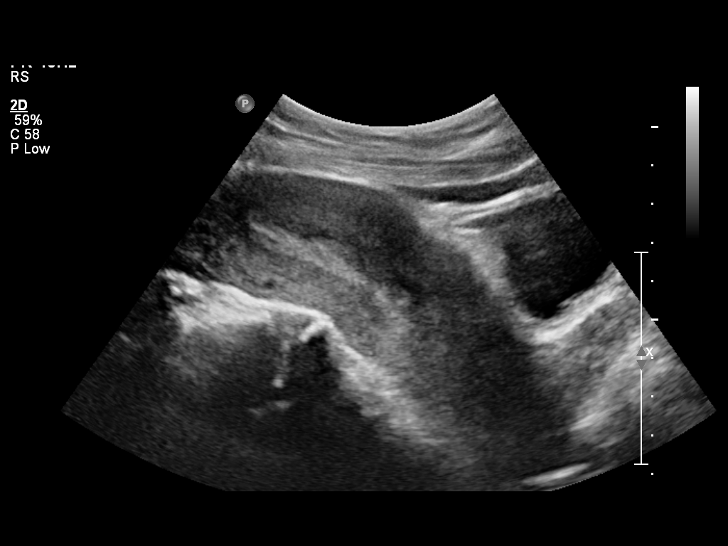
[im 5/37]
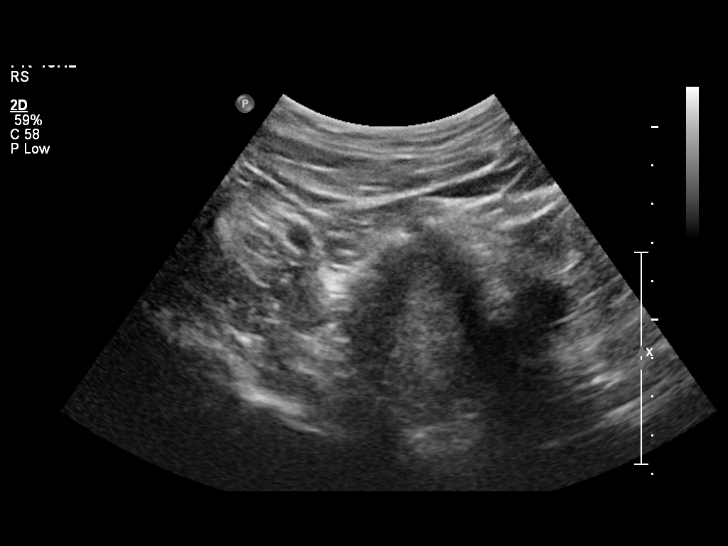
[im 7/37]
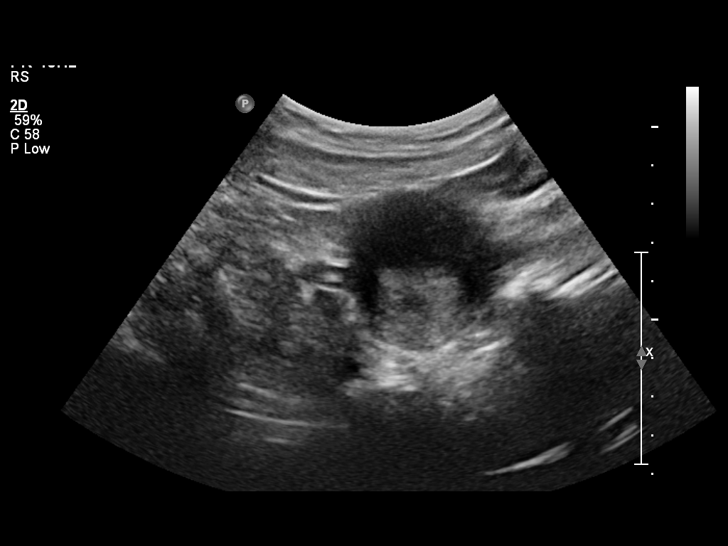
[im 10/37]
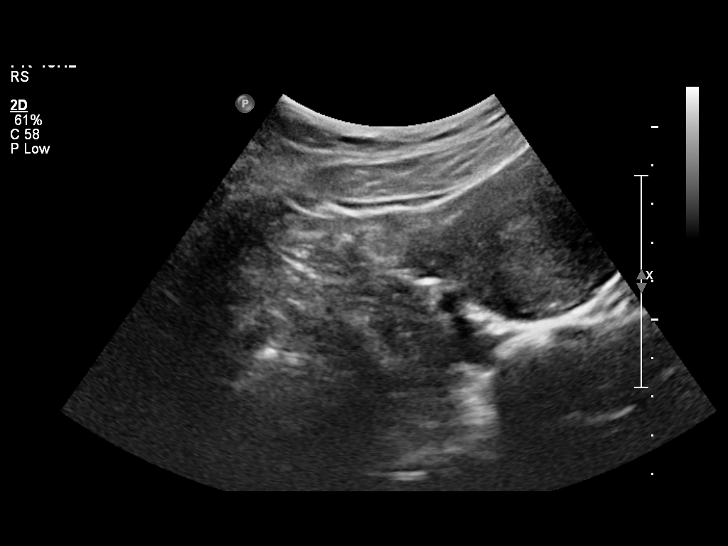
[im 13/37]
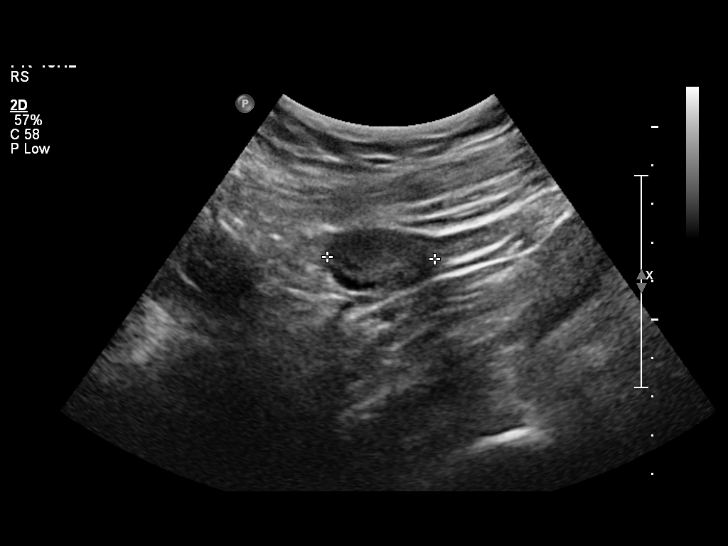
[im 15/37]
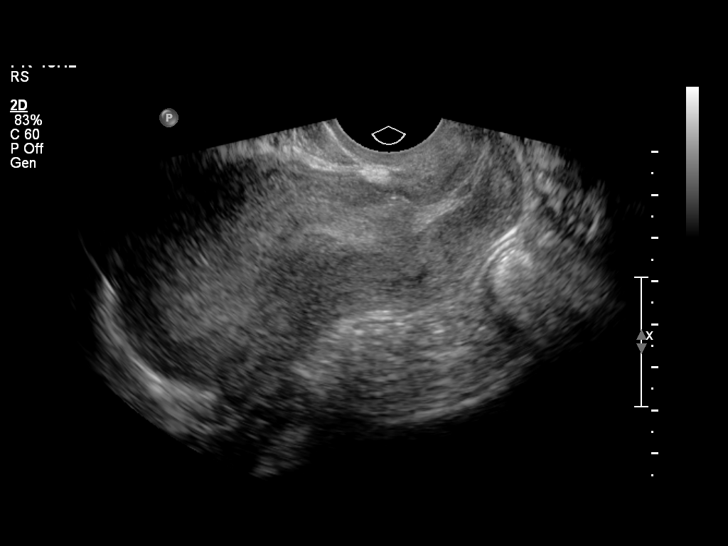
[im 18/37]
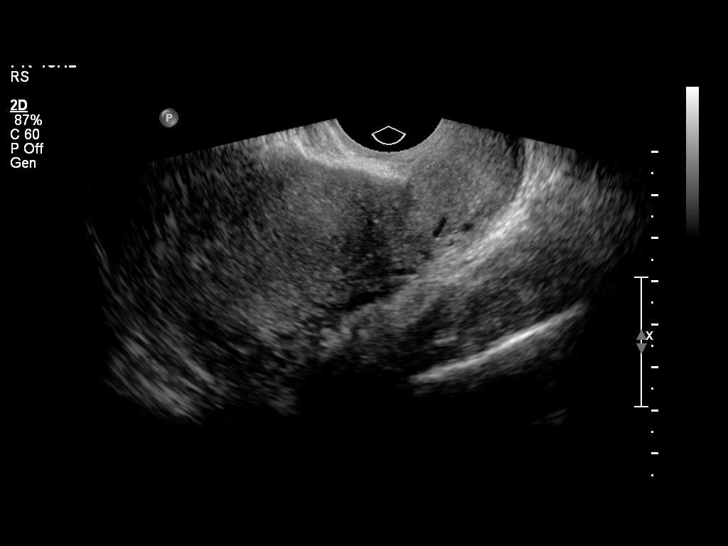
[im 21/37]
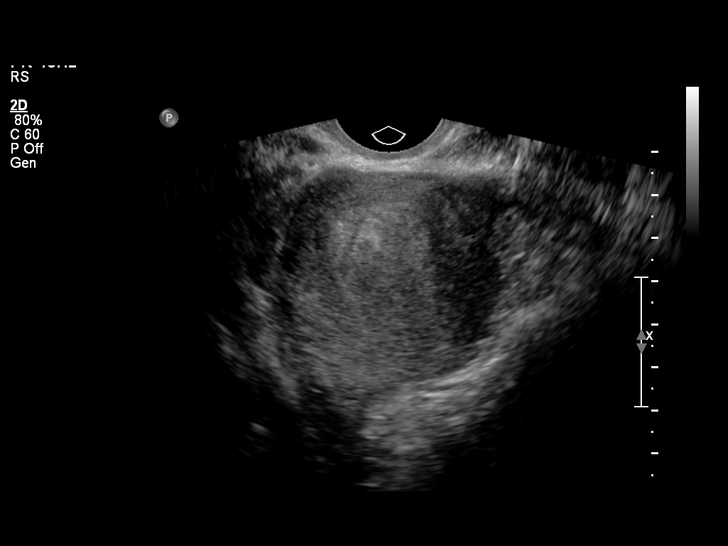
[im 23/37]
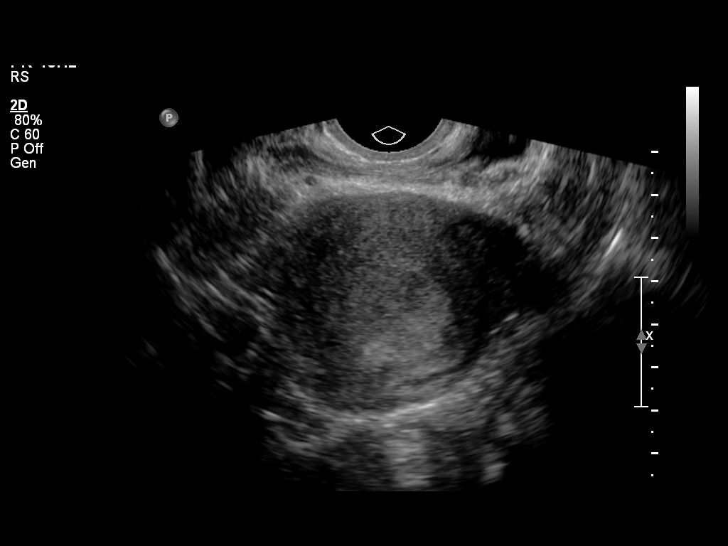
[im 26/37]
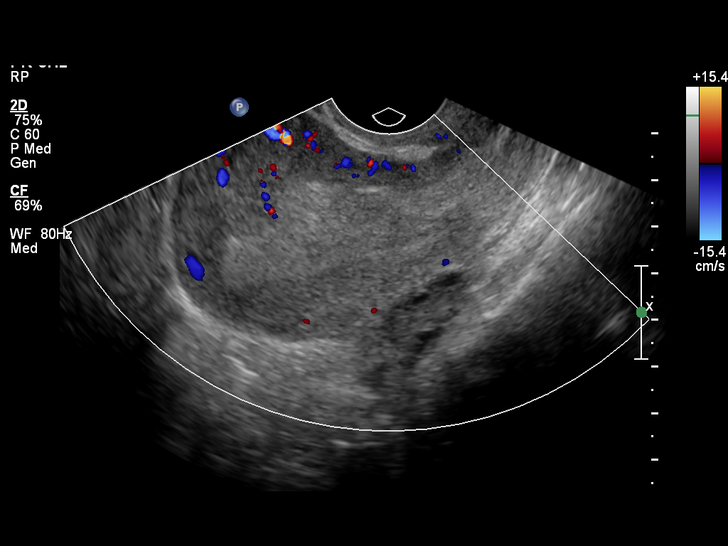
[im 29/37]
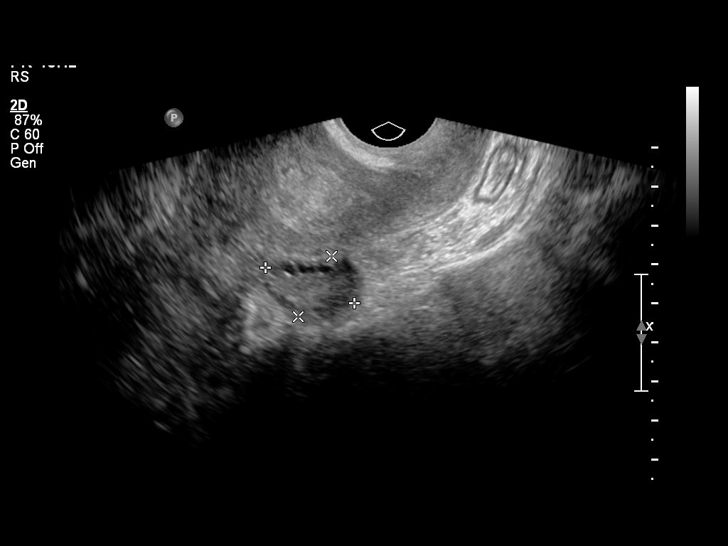
[im 31/37]
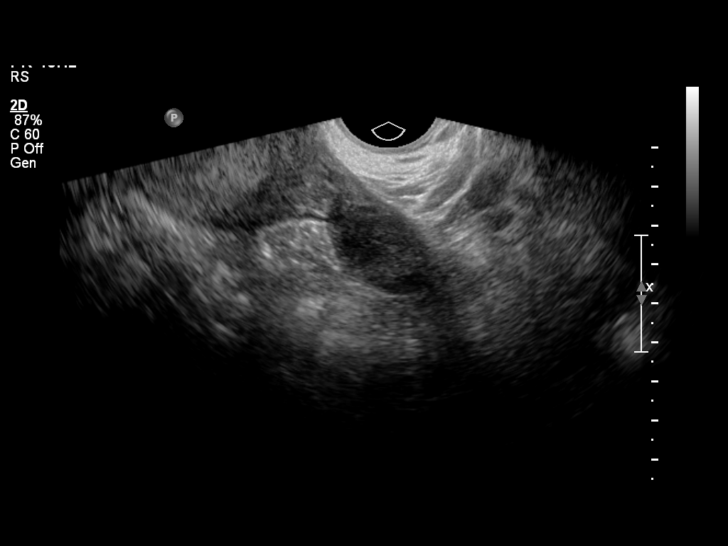
[im 34/37]
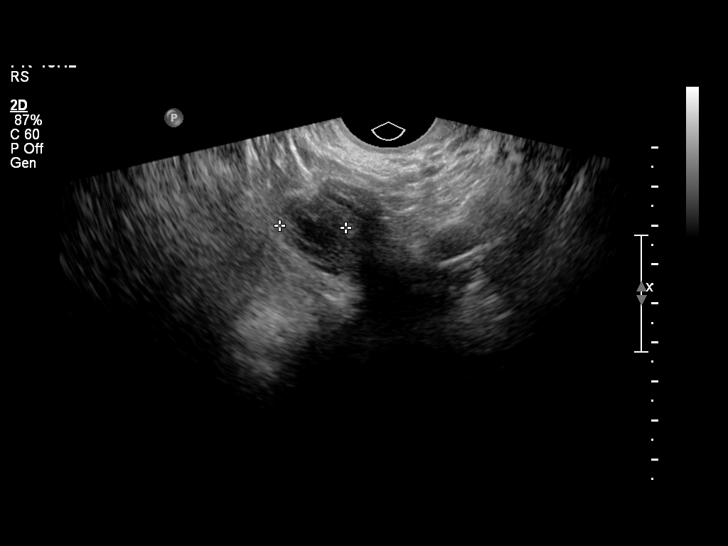
[im 37/37]
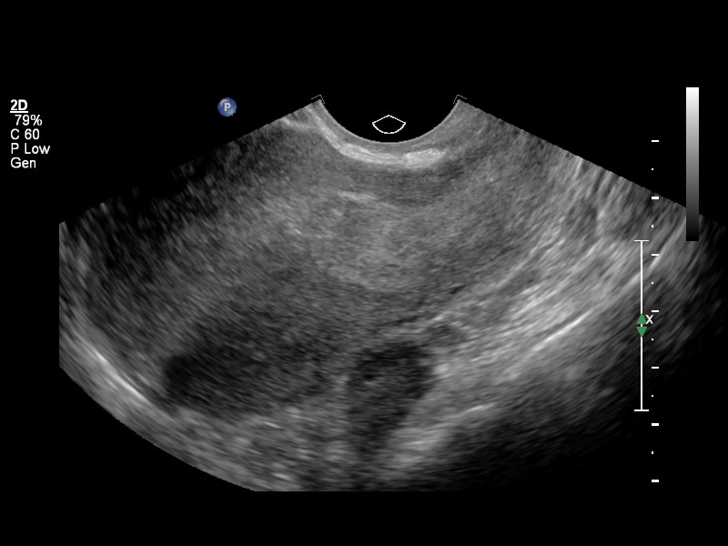

[14 of 28 positions shown; findings below may reference images not displayed]

FINDINGS: Intrauterine gestational sac: None identified

Yolk sac:  Not identified

Embryo:  Not identified

Maternal uterus/adnexae: Bilateral ovaries and uterus are
unremarkable. Probable corpus luteum noted on the left. No evidence
of free fluid.
IMPRESSION: No intrauterine gestational sac identified. Differential
considerations include very early intrauterine pregnancy, failed
intrauterine pregnancy and ectopic pregnancy. Recommend clinical
correlation with serial quantitative beta HCG levels in 48 hr.

If clinically warranted, repeat ultrasound could be performed in
7-10 days.

## 2018-11-04 ENCOUNTER — Ambulatory Visit (HOSPITAL_COMMUNITY)
Admission: EM | Admit: 2018-11-04 | Discharge: 2018-11-04 | Disposition: A | Discharge status: Home / Self Care | Payer: 59 | Attending: Family Medicine | Admitting: Family Medicine

## 2018-11-04 ENCOUNTER — Other Ambulatory Visit: Payer: Self-pay

## 2018-11-04 ENCOUNTER — Encounter (HOSPITAL_COMMUNITY): Payer: Self-pay

## 2018-11-04 DIAGNOSIS — I10 Essential (primary) hypertension: Secondary | ICD-10-CM | POA: Diagnosis present

## 2018-11-04 LAB — COMPREHENSIVE METABOLIC PANEL
ALT: 26 U/L (ref 0–44)
AST: 65 U/L — ABNORMAL HIGH (ref 15–41)
Albumin: 3.4 g/dL — ABNORMAL LOW (ref 3.5–5.0)
Alkaline Phosphatase: 88 U/L (ref 38–126)
Anion gap: 14 (ref 5–15)
BUN: 7 mg/dL (ref 6–20)
CO2: 23 mmol/L (ref 22–32)
Calcium: 9.1 mg/dL (ref 8.9–10.3)
Chloride: 99 mmol/L (ref 98–111)
Creatinine, Ser: 0.67 mg/dL (ref 0.44–1.00)
GFR calc Af Amer: >60 mL/min (ref >60)
GFR calc non Af Amer: >60 mL/min (ref >60)
Glucose, Bld: 82 mg/dL (ref 70–99)
Potassium: 3.2 mmol/L — ABNORMAL LOW (ref 3.5–5.1)
Sodium: 136 mmol/L (ref 135–145)
Total Bilirubin: 0.7 mg/dL (ref 0.3–1.2)
Total Protein: 7.9 g/dL (ref 6.5–8.1)

## 2018-11-04 LAB — CBC WITH DIFFERENTIAL/PLATELET
Abs Immature Granulocytes: 0.03 10*3/uL (ref 0.00–0.07)
Basophils Absolute: 0 10*3/uL (ref 0.0–0.1)
Basophils Relative: 0 %
Eosinophils Absolute: 0.1 10*3/uL (ref 0.0–0.5)
Eosinophils Relative: 1 %
HCT: 33.4 % — ABNORMAL LOW (ref 36.0–46.0)
Hemoglobin: 10.4 g/dL — ABNORMAL LOW (ref 12.0–15.0)
Immature Granulocytes: 0 %
Lymphocytes Relative: 22 %
Lymphs Abs: 1.7 10*3/uL (ref 0.7–4.0)
MCH: 24 pg — ABNORMAL LOW (ref 26.0–34.0)
MCHC: 31.1 g/dL (ref 30.0–36.0)
MCV: 77.1 fL — ABNORMAL LOW (ref 80.0–100.0)
Monocytes Absolute: 0.5 10*3/uL (ref 0.1–1.0)
Monocytes Relative: 7 %
Neutro Abs: 5.3 10*3/uL (ref 1.7–7.7)
Neutrophils Relative %: 70 %
Platelets: 226 10*3/uL (ref 150–400)
RBC: 4.33 MIL/uL (ref 3.87–5.11)
RDW: 20.2 % — ABNORMAL HIGH (ref 11.5–15.5)
WBC: 7.6 10*3/uL (ref 4.0–10.5)
nRBC: 0 % (ref 0.0–0.2)

## 2018-11-04 MED ORDER — AMLODIPINE BESYLATE 5 MG PO TABS: 5.0000 mg | ORAL_TABLET | Freq: Every day | ORAL | 1 refills | Status: AC

## 2018-11-04 NOTE — Discharge Instructions (Addendum)
I am starting you on amlodipine Please start this today.  Come back tomorrow morning for recheck.  Try to purchase a blood pressure cuff to monitor your blood pressures at home. Make sure you are drinking plenty of water and watching your sodium intake. If you start developing any worrisome signs and symptoms that we discussed you need to go straight to the ER. I will call you if your blood work is abnormal.

## 2018-11-04 NOTE — ED Triage Notes (Signed)
Pt presents to the UC today because she feel her blood pressure is high, she is having tingling in her left arm and left leg x 2 days and headache.

## 2018-11-04 NOTE — ED Provider Notes (Signed)
[place]    CSN: [id number] Arrival date & time: 11/04/18  1219      History   Chief Complaint Chief Complaint  Patient presents with  . Headache  . Hypertension  . Arm Problem  . Leg Problem    HPI Donna Alvarado is a 33 y.o. female.   Patient is a 33 year old female past medical history of anxiety, depression, pregnancy-induced hypertension, preeclampsia.  She presents today with elevated blood pressure.  Symptoms have been constant.  She has had some associated dull occipital headaches and numbness and tingling to the face and extremities.  Denies any current numbness, tingling, weakness, headaches, dizziness or blurred vision.  She reports being under a tremendous amount of stress.  She was started on amlodipine approximately 1 year ago by PCP.  When the medication ran out she was unable to refill due to loss of job and insurance purposes.  Denies any chest pain or shortness of breath.  ROS per HPI    Headache Hypertension Associated symptoms include headaches.    Past Medical History:  Diagnosis Date  . Anxiety   . Chest pain, atypical    per pt ? from Ultram- upper right chest pain  . Depression    had PP depression with meds, had harmful thoughts dueing this time  . Endometriosis   . Headache(784.0)   . Pregnancy induced hypertension   . Shortness of breath     Patient Active Problem List   Diagnosis Date Noted  . Preeclampsia, third trimester 11/14/2015  . Non-reassuring electronic fetal monitoring tracing 11/14/2015  . Postpartum care following cesarean delivery 11/14/2015  . Preeclampsia 05/07/2013  . Mild or unspecified pre-eclampsia, with delivery 05/06/2013  . Anemia 05/06/2013  . Pre-eclampsia, mild, postpartum 05/06/2013  . S/P repeat low transverse C-section 05/02/2013  . 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 Depression 12/14/2012  . Generalized anxiety disorder 12/14/2012    Past Surgical History:  Procedure Laterality Date   . CESAREAN SECTION N/A 05/01/2013   Procedure: CESAREAN SECTION;  Surgeon: Delice Lesch, MD;  Location: [facility];  Service: Obstetrics;  Laterality: N/A;  . CESAREAN SECTION N/A 11/14/2015   Procedure: CESAREAN SECTION;  Surgeon: Sherlyn Hay, DO;  Location: [facility];  Service: Obstetrics;  Laterality: N/A;  . LAPAROSCOPY  11/03/2010   Procedure: LAPAROSCOPY DIAGNOSTIC;  Surgeon: Daria Pastures;  Location: [facility];  Service: Gynecology;;  Chromopertubation    OB History    Gravida  3   Para  2   Term  2   Preterm  0   AB  1   Living  2     SAB  1   TAB  0   Ectopic  0   Multiple  0   Live Births  2            Home Medications    Prior to Admission medications   Medication Sig Start Date End Date Taking? Authorizing Provider  acetaminophen (TYLENOL) 500 MG tablet Take 500 mg by mouth every 4 (four) hours as needed for mild pain, moderate pain or headache.    [provider]  amLODipine (NORVASC) 5 MG tablet Take 1 tablet (5 mg total) by mouth daily. 11/04/18   Loura Halt A, NP  labetalol (NORMODYNE) 300 MG tablet Take 1 tablet (300 mg total) by mouth every 8 (eight) hours. 11/18/15 11/04/18  Bovard-Stuckert, Jeral Fruit, MD  NIFEdipine (PROCARDIA XL/ADALAT-CC) 90 MG 24 hr tablet Take 1 tablet (90  mg total) by mouth daily. 11/19/15 11/04/18  Bovard-Stuckert, Augusto Gamble, MD  sertraline (ZOLOFT) 50 MG tablet Take 1 tablet (50 mg total) by mouth 2 (two) times daily. 11/18/15 11/04/18  Bovard-StuckertAugusto Gamble, MD    Family History Family History  Problem Relation Age of Onset  . Alcohol abuse Neg Hx   . Arthritis Neg Hx   . Asthma Neg Hx   . Cancer Neg Hx   . Birth defects Neg Hx   . COPD Neg Hx   . Depression Neg Hx   . Diabetes Neg Hx   . Drug abuse Neg Hx   . Early death Neg Hx   . Hearing loss Neg Hx   . Heart disease Neg Hx   . Hyperlipidemia Neg Hx   . Hypertension Neg Hx   . Kidney disease Neg Hx   . Learning disabilities Neg Hx    . Mental illness Neg Hx   . Mental retardation Neg Hx   . Miscarriages / Stillbirths Neg Hx   . Stroke Neg Hx   . Vision loss Neg Hx   . Varicose Veins Neg Hx     Social History Social History   Tobacco Use  . Smoking status: Former Smoker    Types: Cigarettes    Quit date: 06/25/2012    Years since quitting: 6.3  . Smokeless tobacco: Never Used  Substance Use Topics  . Alcohol use: No    Comment: occasional  . Drug use: No     Allergies   Sulfa antibiotics   Review of Systems Review of Systems  Neurological: Positive for headaches.     Physical Exam Triage Vital Signs ED Triage Vitals  Enc Vitals Group     BP 11/04/18 1240 (!) 204/124     Pulse --      Resp 11/04/18 1240 16     Temp 11/04/18 1240 98.4 F (36.9 C)     Temp Source 11/04/18 1240 Temporal     SpO2 11/04/18 1240 100 %     Weight --      Height --      Head Circumference --      Peak Flow --      Pain Score 11/04/18 1237 2     Pain Loc --      Pain Edu? --      Excl. in GC? --    No data found.  Updated Vital Signs BP (!) 192/129 (BP Location: Left Arm)   Temp 98.4 F (36.9 C) (Temporal)   Resp 16   SpO2 100%   Visual Acuity Right Eye Distance:   Left Eye Distance:   Bilateral Distance:    Right Eye Near:   Left Eye Near:    Bilateral Near:     Physical Exam Vitals signs and nursing note reviewed.  Constitutional:      General: She is not in acute distress.    Appearance: She is well-developed. She is not ill-appearing, toxic-appearing or diaphoretic.  HENT:     Head: Normocephalic and atraumatic.     Nose: Nose normal.     Mouth/Throat:     Pharynx: Oropharynx is clear.  Eyes:     Extraocular Movements: Extraocular movements intact.     Conjunctiva/sclera: Conjunctivae normal.     Pupils: Pupils are equal, round, and reactive to light.  Neck:     Musculoskeletal: Normal range of motion.  Cardiovascular:     Rate and Rhythm: Normal rate and regular rhythm.  Pulses: Normal pulses.     Heart sounds: Normal heart sounds.  Pulmonary:     Effort: Pulmonary effort is normal.     Breath sounds: Normal breath sounds.  Musculoskeletal: Normal range of motion.  Skin:    General: Skin is warm.  Neurological:     General: No focal deficit present.     Mental Status: She is alert.     Comments: Strength 5 out of 5 in all extremities.  No slurred speech, facial droop or sensory deficit. Cranial nerves grossly intact.  Psychiatric:        Mood and Affect: Mood normal.      UC Treatments / Results  Labs (all labs ordered are listed, but only abnormal results are displayed) Labs Reviewed  CBC WITH DIFFERENTIAL/PLATELET  COMPREHENSIVE METABOLIC PANEL    EKG   Radiology No results found.  Procedures Procedures (including critical care time)  Medications Ordered in UC Medications - No data to display  Initial Impression / Assessment and Plan / UC Course  I have reviewed the triage vital signs and the nursing notes.  Pertinent labs & imaging results that were available during my care of the patient were reviewed by me and considered in my medical decision making (see chart for details).     Essential hypertension- treating with amlodipine Obtaining basic labs.  Pt has been symptomatic in the past but currently asymptomatic today.  Neurological exam normal. No red flags Patient's blood pressure 192/129 here in clinic. We will start her on amlodipine and have her monitor her blood pressures.  We will have her come back tomorrow for recheck. Discussed worrisome signs and symptoms and red flags to go to the ER.  Patient understanding and agree.  Final Clinical Impressions(s) / UC Diagnoses   Final diagnoses:  Essential hypertension     Discharge Instructions     I am starting you on amlodipine Please start this today.  Come back tomorrow morning for recheck.  Try to purchase a blood pressure cuff to monitor your blood pressures  at home. Make sure you are drinking plenty of water and watching your sodium intake. If you start developing any worrisome signs and symptoms that we discussed you need to go straight to the ER. I will call you if your blood work is abnormal.     ED Prescriptions    Medication Sig Dispense Auth. Provider   amLODipine (NORVASC) 5 MG tablet Take 1 tablet (5 mg total) by mouth daily. 30 tablet Storm Dulski, TraciDahlia Byes A, NP     PDMP not reviewed this encounter.   Janace ArisBast, Ashaun Gaughan A, NP 11/04/18 1341

## 2018-11-06 ENCOUNTER — Telehealth (HOSPITAL_COMMUNITY): Payer: Self-pay | Admitting: Emergency Medicine

## 2018-11-06 NOTE — Telephone Encounter (Signed)
Per Tressia Miners, pt was supposed to return for repeat BP check, pt did not return, potassium is low, per traci to increase potassium in diet. Called x2 no answer, left voicemail to return call.

## 2018-11-06 NOTE — Telephone Encounter (Signed)
Pt returned call, pt is taking the blood pressure medicine, states ALL her symptoms have resolved, she will get a BP monitor tomorrow and follow up with her PCP after keeping a log, and will increase her potassium in her diet.

## 2019-04-15 ENCOUNTER — Emergency Department (HOSPITAL_COMMUNITY): Payer: 59

## 2019-04-15 ENCOUNTER — Emergency Department (HOSPITAL_COMMUNITY)
Admission: EM | Admit: 2019-04-15 | Discharge: 2019-04-15 | Disposition: A | Discharge status: Home / Self Care | Payer: 59 | Attending: Emergency Medicine | Admitting: Emergency Medicine

## 2019-04-15 DIAGNOSIS — R945 Abnormal results of liver function studies: Secondary | ICD-10-CM | POA: Insufficient documentation

## 2019-04-15 DIAGNOSIS — Z79899 Other long term (current) drug therapy: Secondary | ICD-10-CM | POA: Insufficient documentation

## 2019-04-15 DIAGNOSIS — Z87891 Personal history of nicotine dependence: Secondary | ICD-10-CM | POA: Insufficient documentation

## 2019-04-15 DIAGNOSIS — R7989 Other specified abnormal findings of blood chemistry: Secondary | ICD-10-CM

## 2019-04-15 DIAGNOSIS — R202 Paresthesia of skin: Secondary | ICD-10-CM

## 2019-04-15 LAB — BASIC METABOLIC PANEL
Anion gap: 10 (ref 5–15)
BUN: 10 mg/dL (ref 6–20)
CO2: 29 mmol/L (ref 22–32)
Calcium: 10.1 mg/dL (ref 8.9–10.3)
Chloride: 100 mmol/L (ref 98–111)
Creatinine, Ser: 0.93 mg/dL (ref 0.44–1.00)
GFR calc Af Amer: >60 mL/min (ref >60)
GFR calc non Af Amer: >60 mL/min (ref >60)
Glucose, Bld: 89 mg/dL (ref 70–99)
Potassium: 4.3 mmol/L (ref 3.5–5.1)
Sodium: 139 mmol/L (ref 135–145)

## 2019-04-15 LAB — CBC
HCT: 48.3 % — ABNORMAL HIGH (ref 36.0–46.0)
Hemoglobin: 15.9 g/dL — ABNORMAL HIGH (ref 12.0–15.0)
MCH: 30.5 pg (ref 26.0–34.0)
MCHC: 32.9 g/dL (ref 30.0–36.0)
MCV: 92.5 fL (ref 80.0–100.0)
Platelets: 182 10*3/uL (ref 150–400)
RBC: 5.22 MIL/uL — ABNORMAL HIGH (ref 3.87–5.11)
RDW: 12.1 % (ref 11.5–15.5)
WBC: 4.4 10*3/uL (ref 4.0–10.5)
nRBC: 0 % (ref 0.0–0.2)

## 2019-04-15 LAB — HEPATIC FUNCTION PANEL
ALT: 50 U/L — ABNORMAL HIGH (ref 0–44)
AST: 122 U/L — ABNORMAL HIGH (ref 15–41)
Albumin: 3.2 g/dL — ABNORMAL LOW (ref 3.5–5.0)
Alkaline Phosphatase: 99 U/L (ref 38–126)
Bilirubin, Direct: 0.1 mg/dL (ref 0.0–0.2)
Indirect Bilirubin: 0.8 mg/dL (ref 0.3–0.9)
Total Bilirubin: 0.9 mg/dL (ref 0.3–1.2)
Total Protein: 8.6 g/dL — ABNORMAL HIGH (ref 6.5–8.1)

## 2019-04-15 LAB — I-STAT BETA HCG BLOOD, ED (MC, WL, AP ONLY): I-stat hCG, quantitative: <5 m[IU]/mL (ref <5)

## 2019-04-15 LAB — TROPONIN I (HIGH SENSITIVITY)
Troponin I (High Sensitivity): <2 ng/L (ref <18)
Troponin I (High Sensitivity): 6 ng/L (ref <18)

## 2019-04-15 LAB — VITAMIN B12: Vitamin B-12: 559 pg/mL (ref 180–914)

## 2019-04-15 MED ORDER — SODIUM CHLORIDE 0.9 % IV BOLUS
1000.0000 mL | Freq: Once | INTRAVENOUS | Status: AC
  Administered 2019-04-15: 1000 mL via INTRAVENOUS

## 2019-04-15 MED ORDER — SODIUM CHLORIDE 0.9% FLUSH: 3.0000 mL | Freq: Once | INTRAVENOUS | Status: DC

## 2019-04-15 MED ORDER — THIAMINE HCL 100 MG/ML IJ SOLN
100.0000 mg | Freq: Once | INTRAMUSCULAR | Status: AC
  Administered 2019-04-15: 100 mg via INTRAVENOUS
  Filled 2019-04-15: qty 2

## 2019-04-15 NOTE — ED Triage Notes (Signed)
Pt here from home with chronic arm tingling and htn , pt has been taking her meds for 1 year ,

## 2019-04-15 NOTE — ED Notes (Signed)
Taken to CT.

## 2019-04-15 NOTE — ED Provider Notes (Signed)
[facility] EMERGENCY DEPARTMENT Provider Note   CSN: [id number] Arrival date & time: 04/15/19  1206     History No chief complaint on file.   Donna Alvarado is a 34 y.o. female past medical history of anxiety, depression, endometriosis who presents for evaluation of left upper extremity, left lower extremity, facial tingling that is been ongoing since September 2020.  Patient reports that this has been an ongoing issue.  She has been seen by urgent care and states she also did a telehealth visit for evaluation of her symptoms.  She states that she continues to have symptoms and states it is not getting better, prompting ED visit today.  She describes it as "feeling like enough not enough blood flow is getting to my limbs and feeling like my bones are numb."  She states she can feel because her skin can feel but she has to continuously pop her fingers and move her legs to remind her that they are there.  She reports that since September, she has also had a "anxious type pain" in her chest.  She states that this is constant.  She also feels like at times, she has trouble breathing.  She states that this makes it difficult for her to sleep.  She does report that she takes 9 puffs of tobacco a day and reports that she drinks about half of a 750 mL 40% alcohol every day.  She reports that she has been stressed since returning to work in September 2020 and that she has felt anxious.  She is also worried about her blood pressure.  She was started on amlodipine which she takes but states that she continues to have high blood pressures. She denies any OCP use, recent immobilization, prior history of DVT/PE, recent surgery, leg swelling, or long travel.  She states that she feels like her whole left side is swollen and does feel like maybe sometimes she has some swelling in her left calf.   The history is provided by the patient.       Past Medical History:  Diagnosis Date  .  Anxiety   . Chest pain, atypical    per pt ? from Ultram- upper right chest pain  . Depression    had PP depression with meds, had harmful thoughts dueing this time  . Endometriosis   . Headache(784.0)   . Pregnancy induced hypertension   . Shortness of breath     Patient Active Problem List   Diagnosis Date Noted  . Preeclampsia, third trimester 11/14/2015  . Non-reassuring electronic fetal monitoring tracing 11/14/2015  . Postpartum care following cesarean delivery 11/14/2015  . Preeclampsia 05/07/2013  . Mild or unspecified pre-eclampsia, with delivery 05/06/2013  . Anemia 05/06/2013  . Pre-eclampsia, mild, postpartum 05/06/2013  . S/P repeat low transverse C-section 05/02/2013  . 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 Depression 12/14/2012  . Generalized anxiety disorder 12/14/2012    Past Surgical History:  Procedure Laterality Date  . CESAREAN SECTION N/A 05/01/2013   Procedure: CESAREAN SECTION;  Surgeon: Purcell Nails, MD;  Location: [facility];  Service: Obstetrics;  Laterality: N/A;  . CESAREAN SECTION N/A 11/14/2015   Procedure: CESAREAN SECTION;  Surgeon: Edwinna Areola, DO;  Location: [facility];  Service: Obstetrics;  Laterality: N/A;  . LAPAROSCOPY  11/03/2010   Procedure: LAPAROSCOPY DIAGNOSTIC;  Surgeon: Loney Laurence;  Location: [facility];  Service: Gynecology;;  Chromopertubation     OB History    [place]  3   Para  2   Term  2   Preterm  0   AB  1   Living  2     SAB  1   TAB  0   Ectopic  0   Multiple  0   Live Births  2           Family History  Problem Relation Age of Onset  . Alcohol abuse Neg Hx   . Arthritis Neg Hx   . Asthma Neg Hx   . Cancer Neg Hx   . Birth defects Neg Hx   . COPD Neg Hx   . Depression Neg Hx   . Diabetes Neg Hx   . Drug abuse Neg Hx   . Early death Neg Hx   . Hearing loss Neg Hx   . Heart disease Neg Hx   . Hyperlipidemia Neg Hx   . Hypertension Neg Hx   . Kidney disease Neg Hx   .  Learning disabilities Neg Hx   . Mental illness Neg Hx   . Mental retardation Neg Hx   . Miscarriages / Stillbirths Neg Hx   . Stroke Neg Hx   . Vision loss Neg Hx   . Varicose Veins Neg Hx     Social History   Tobacco Use  . Smoking status: Former Smoker    Types: Cigarettes    Quit date: 06/25/2012    Years since quitting: 6.8  . Smokeless tobacco: Never Used  Substance Use Topics  . Alcohol use: No    Comment: occasional  . Drug use: No    Home Medications Prior to Admission medications   Medication Sig Start Date End Date Taking? Authorizing Provider  acetaminophen (TYLENOL) 500 MG tablet Take 500 mg by mouth every 4 (four) hours as needed for mild pain, moderate pain or headache.    [provider]  amLODipine (NORVASC) 5 MG tablet Take 1 tablet (5 mg total) by mouth daily. 11/04/18   Dahlia Byes A, NP  labetalol (NORMODYNE) 300 MG tablet Take 1 tablet (300 mg total) by mouth every 8 (eight) hours. 11/18/15 11/04/18  Bovard-Stuckert, Augusto Gamble, MD  NIFEdipine (PROCARDIA XL/ADALAT-CC) 90 MG 24 hr tablet Take 1 tablet (90 mg total) by mouth daily. 11/19/15 11/04/18  Bovard-Stuckert, Augusto Gamble, MD  sertraline (ZOLOFT) 50 MG tablet Take 1 tablet (50 mg total) by mouth 2 (two) times daily. 11/18/15 11/04/18  Bovard-Stuckert, Augusto Gamble, MD    Allergies    Sulfa antibiotics  Review of Systems   Review of Systems  Constitutional: Negative for fever.  Respiratory: Positive for cough. Negative for shortness of breath.   Cardiovascular: Negative for chest pain.  Gastrointestinal: Negative for abdominal pain, nausea and vomiting.  Genitourinary: Negative for dysuria and hematuria.  Neurological: Positive for numbness. Negative for weakness and headaches.  All other systems reviewed and are negative.   Physical Exam Updated Vital Signs BP (!) 149/109 (BP Location: Right Arm)   Pulse 84   Temp 98.1 F (36.7 C) (Oral)   Resp 17   LMP 04/13/2019   SpO2 100%   Physical  Exam Vitals and nursing note reviewed.  Constitutional:      Appearance: Normal appearance. She is well-developed.  HENT:     Head: Normocephalic and atraumatic.  Eyes:     General: Lids are normal.     Conjunctiva/sclera: Conjunctivae normal.     Pupils: Pupils are equal, round, and reactive to light.  Comments: PERRL. EOMs intact. No nystagmus. No neglect.   Cardiovascular:     Rate and Rhythm: Normal rate and regular rhythm.     Pulses: Normal pulses.          Radial pulses are 2+ on the right side and 2+ on the left side.       Dorsalis pedis pulses are 2+ on the right side and 2+ on the left side.     Heart sounds: Normal heart sounds. No murmur. No friction rub. No gallop.   Pulmonary:     Effort: Pulmonary effort is normal.     Breath sounds: Normal breath sounds.     Comments: Lungs clear to auscultation bilaterally.  Symmetric chest rise.  No wheezing, rales, rhonchi. Abdominal:     Palpations: Abdomen is soft. Abdomen is not rigid.     Tenderness: There is no abdominal tenderness. There is no guarding.  Musculoskeletal:        General: Normal range of motion.     Cervical back: Full passive range of motion without pain.     Comments: Bilateral lower extremities are symmetric in appearance without any overlying warmth, erythema, edema.  Skin:    General: Skin is warm and dry.     Capillary Refill: Capillary refill takes less than 2 seconds.     Comments: Good distal cap refill.  BUE and BLE is not dusky in appearance or cool to touch.  Neurological:     Mental Status: She is alert and oriented to person, place, and time.     Comments: Cranial nerves III-XII intact Follows commands, Moves all extremities  5/5 strength to BUE and BLE  Patient reports that she can feel me touching her on her left face, left upper extremity, left lower extremity but feels like "inside is numb." Sensation intact throughout all major nerve distributions No gait abnormalities  No slurred  speech. No facial droop.   Psychiatric:        Speech: Speech normal.     ED Results / Procedures / Treatments   Labs (all labs ordered are listed, but only abnormal results are displayed) Labs Reviewed  CBC - Abnormal; Notable for the following components:      Result Value   RBC 5.22 (*)    Hemoglobin 15.9 (*)    HCT 48.3 (*)    All other components within normal limits  HEPATIC FUNCTION PANEL - Abnormal; Notable for the following components:   Total Protein 8.6 (*)    Albumin 3.2 (*)    AST 122 (*)    ALT 50 (*)    All other components within normal limits  BASIC METABOLIC PANEL  VITAMIN B12  I-STAT BETA HCG BLOOD, ED ([facility], [facility], [facility] ONLY)  TROPONIN I (HIGH SENSITIVITY)  TROPONIN I (HIGH SENSITIVITY)    EKG EKG Interpretation  Date/Time:  Monday April 15 2019 12:36:00 EDT Ventricular Rate:  110 PR Interval:  128 QRS Duration: 76 QT Interval:  316 QTC Calculation: 427 R Axis:   56 Text Interpretation: Sinus tachycardia Nonspecific T wave abnormality Abnormal ECG SINCE LAST TRACING HEART RATE HAS INCREASED Confirmed by Rolan Bucco [phone]) on 04/16/2019 9:33:50 AM   Radiology DG Chest 2 View  Result Date: 04/15/2019 CLINICAL DATA:  Shortness of breath. Midline chest pain. EXAM: CHEST - 2 VIEW COMPARISON:  None. FINDINGS: EKG leads overlie the chest.The cardiomediastinal contours are normal. The lungs are clear. Pulmonary vasculature is normal. No consolidation, pleural effusion, or pneumothorax. No  acute osseous abnormalities are seen. IMPRESSION: Negative radiographs of the chest. Electronically Signed   By: Narda RutherfordMelanie  Sanford M.D.   On: 04/15/2019 13:05   CT Head Wo Contrast  Result Date: 04/15/2019 CLINICAL DATA:  Paresthesias EXAM: CT HEAD WITHOUT CONTRAST TECHNIQUE: Contiguous axial images were obtained from the base of the skull through the vertex without intravenous contrast. COMPARISON:  None. FINDINGS: Brain: No acute infarct or hemorrhage. Lateral ventricles and  midline structures appear unremarkable. No acute extra-axial fluid collections. No mass effect. Vascular: No hyperdense vessel or unexpected calcification. Skull: Normal. Negative for fracture or focal lesion. Sinuses/Orbits: No acute finding. Other: None IMPRESSION: No acute infarct, hemorrhage or mass effect. Electronically Signed   By: Sharlet SalinaMichael  Brown M.D.   On: 04/15/2019 15:08    Procedures Procedures (including critical care time)  Medications Ordered in ED Medications  thiamine (B-1) injection 100 mg (100 mg Intravenous Given 04/15/19 1432)  sodium chloride 0.9 % bolus 1,000 mL (0 mLs Intravenous Stopped 04/15/19 1618)    ED Course  I have reviewed the triage vital signs and the nursing notes.  Pertinent labs & imaging results that were available during my care of the patient were reviewed by me and considered in my medical decision making (see chart for details).    MDM Rules/Calculators/A&P                      34 year old female who presents for evaluation of left upper extremity and left lower extremity tingling sensation that has been ongoing since September 2020.  She states that this has been an ongoing issue and states that it is persisted.  She states that she "feels like her inside is numb and her bones are numb.  She states she can only feel because of the skin on her arms and legs."  She was seen by urgent care and to help telehealth visit with PCP with no conclusion of her symptoms, prompting ED visit today.  Also reports chest pain that has been ongoing since September 2020.  On initial ED arrival, she is afebrile, nontoxic-appearing.  Vital signs are stable.  She is slightly tachycardic and hypertensive.  I suspect this may be due to anxiety as well as history of hypertension.  On exam, she has no weakness.  She states she can feel me touching her but states it feels numb on the inside.  No other neuro deficits.  Sensation otherwise intact.  At this time, given the chronicity  of symptoms, do not feel that this is representative of acute CVA.  Additionally, history/physical exam is not concerning for ischemic limb.  Do not suspect DVT of upper or lower extremity based on history/physical exam.  Initial labs ordered at triage. Given the length of her symptoms, will plan for CT head for evaluation of acute intracranial abnormality such as a mass.  I-STAT beta negative.  Troponin negative.  BMP is unremarkable.  CBC is unremarkable.  Chest x-ray negative for any acute abnormalities.  CT head negative for any acute intracranial abnormalities.  Discussed results with patient.  Patient actually reports feeling better since being here in the ED.  We will plan to give her outpatient neurology follow-up.  At this time, given her symptoms have been continuous for several months, do not suspect acute CVA as a source of her symptoms.  Additionally, she has no weakness that would indicate emergent MRI imaging.  Additionally, given the chronicity of her symptoms, 1 troponin is sufficient for  ACS rule out. At this time, patient exhibits no emergent life-threatening condition that require further evaluation in ED or admission. Patient had ample opportunity for questions and discussion. All patient's questions were answered with full understanding. Strict return precautions discussed. Patient expresses understanding and agreement to plan.   Portions of this note were generated with [employer]. Dictation errors may occur despite best attempts at proofreading.   Final Clinical Impression(s) / ED Diagnoses Final diagnoses:  Paresthesia of left arm and leg  Elevated liver function tests    Rx / DC Orders ED Discharge Orders         Ordered    Ambulatory referral to Neurology    Comments: An appointment is requested in approximately: 1 week   04/15/19 1609           Desma Mcgregor 04/17/19 Osborne Casco, MD 04/17/19 [phone]

## 2019-04-15 NOTE — Discharge Instructions (Addendum)
I provided you a referral to neurology.  Please follow-up regarding your symptoms.  As we discussed today, your blood work was reassuring.  Your liver functions were slightly elevated.  This needs to be followed up by your doctor.  You should avoid taking Tylenol.  Follow-up with [facility] to establish a primary care doctor if you do not have one.   Return the emergency department for any worsening symptoms, weakness, inability to walk, fever, chest pain, difficulty breathing or any other worsening or concerning symptoms.

## 2020-05-19 ENCOUNTER — Other Ambulatory Visit: Payer: Self-pay

## 2020-05-19 ENCOUNTER — Emergency Department (HOSPITAL_COMMUNITY): Payer: No Typology Code available for payment source

## 2020-05-19 ENCOUNTER — Ambulatory Visit (HOSPITAL_COMMUNITY)
Admission: EM | Admit: 2020-05-19 | Discharge: 2020-05-19 | Disposition: A | Discharge status: Home / Self Care | Payer: 59

## 2020-05-19 ENCOUNTER — Emergency Department (HOSPITAL_COMMUNITY)
Admission: EM | Admit: 2020-05-19 | Discharge: 2020-05-19 | Disposition: A | Discharge status: Home / Self Care | Payer: No Typology Code available for payment source | Attending: Emergency Medicine | Admitting: Emergency Medicine

## 2020-05-19 DIAGNOSIS — R112 Nausea with vomiting, unspecified: Secondary | ICD-10-CM | POA: Diagnosis not present

## 2020-05-19 DIAGNOSIS — Z87891 Personal history of nicotine dependence: Secondary | ICD-10-CM | POA: Insufficient documentation

## 2020-05-19 DIAGNOSIS — E860 Dehydration: Secondary | ICD-10-CM | POA: Diagnosis not present

## 2020-05-19 DIAGNOSIS — R1012 Left upper quadrant pain: Secondary | ICD-10-CM | POA: Diagnosis not present

## 2020-05-19 DIAGNOSIS — R531 Weakness: Secondary | ICD-10-CM | POA: Insufficient documentation

## 2020-05-19 DIAGNOSIS — Z79899 Other long term (current) drug therapy: Secondary | ICD-10-CM | POA: Diagnosis not present

## 2020-05-19 LAB — CBC WITH DIFFERENTIAL/PLATELET
Abs Immature Granulocytes: 0 10*3/uL (ref 0.00–0.07)
Basophils Absolute: 0 10*3/uL (ref 0.0–0.1)
Basophils Relative: 0 %
Eosinophils Absolute: 0 10*3/uL (ref 0.0–0.5)
Eosinophils Relative: 0 %
HCT: 38.9 % (ref 36.0–46.0)
Hemoglobin: 12 g/dL (ref 12.0–15.0)
Lymphocytes Relative: 12 %
Lymphs Abs: 1.4 10*3/uL (ref 0.7–4.0)
MCH: 25 pg — ABNORMAL LOW (ref 26.0–34.0)
MCHC: 30.8 g/dL (ref 30.0–36.0)
MCV: 81 fL (ref 80.0–100.0)
Monocytes Absolute: 0.2 10*3/uL (ref 0.1–1.0)
Monocytes Relative: 2 %
Neutro Abs: 10.2 10*3/uL — ABNORMAL HIGH (ref 1.7–7.7)
Neutrophils Relative %: 86 %
Platelets: 217 10*3/uL (ref 150–400)
RBC: 4.8 MIL/uL (ref 3.87–5.11)
RDW: 22.9 % — ABNORMAL HIGH (ref 11.5–15.5)
WBC: 11.9 10*3/uL — ABNORMAL HIGH (ref 4.0–10.5)
nRBC: 0 /100 WBC
nRBC: 0.2 % (ref 0.0–0.2)

## 2020-05-19 LAB — URINALYSIS, ROUTINE W REFLEX MICROSCOPIC
Bilirubin Urine: NEGATIVE
Glucose, UA: 50 mg/dL — AB
Hgb urine dipstick: NEGATIVE
Ketones, ur: 20 mg/dL — AB
Leukocytes,Ua: NEGATIVE
Nitrite: NEGATIVE
Protein, ur: 100 mg/dL — AB
Specific Gravity, Urine: 1.018 (ref 1.005–1.030)
pH: 6 (ref 5.0–8.0)

## 2020-05-19 LAB — COMPREHENSIVE METABOLIC PANEL
ALT: 24 U/L (ref 0–44)
AST: 89 U/L — ABNORMAL HIGH (ref 15–41)
Albumin: 3.6 g/dL (ref 3.5–5.0)
Alkaline Phosphatase: 88 U/L (ref 38–126)
Anion gap: 19 — ABNORMAL HIGH (ref 5–15)
BUN: 7 mg/dL (ref 6–20)
CO2: 20 mmol/L — ABNORMAL LOW (ref 22–32)
Calcium: 9.3 mg/dL (ref 8.9–10.3)
Chloride: 93 mmol/L — ABNORMAL LOW (ref 98–111)
Creatinine, Ser: 0.93 mg/dL (ref 0.44–1.00)
GFR, Estimated: >60 mL/min (ref >60)
Glucose, Bld: 120 mg/dL — ABNORMAL HIGH (ref 70–99)
Potassium: 4.5 mmol/L (ref 3.5–5.1)
Sodium: 132 mmol/L — ABNORMAL LOW (ref 135–145)
Total Bilirubin: 1.6 mg/dL — ABNORMAL HIGH (ref 0.3–1.2)
Total Protein: 9.1 g/dL — ABNORMAL HIGH (ref 6.5–8.1)

## 2020-05-19 LAB — LIPASE, BLOOD: Lipase: 27 U/L (ref 11–51)

## 2020-05-19 LAB — HCG, QUANTITATIVE, PREGNANCY: hCG, Beta Chain, Quant, S: 1 m[IU]/mL (ref <5)

## 2020-05-19 LAB — LACTIC ACID, PLASMA
Lactic Acid, Venous: 3.2 mmol/L (ref 0.5–1.9)
Lactic Acid, Venous: 1.5 mmol/L (ref 0.5–1.9)

## 2020-05-19 MED ORDER — IOHEXOL 300 MG/ML  SOLN
100.0000 mL | Freq: Once | INTRAMUSCULAR | Status: AC | PRN
  Administered 2020-05-19: 100 mL via INTRAVENOUS

## 2020-05-19 MED ORDER — ONDANSETRON 4 MG PO TBDP
4.0000 mg | ORAL_TABLET | Freq: Once | ORAL | Status: AC
  Administered 2020-05-19: 4 mg via ORAL
  Filled 2020-05-19: qty 1

## 2020-05-19 MED ORDER — MORPHINE SULFATE (PF) 4 MG/ML IV SOLN
4.0000 mg | Freq: Once | INTRAVENOUS | Status: AC
  Administered 2020-05-19: 4 mg via INTRAVENOUS
  Filled 2020-05-19: qty 1

## 2020-05-19 MED ORDER — ACETAMINOPHEN 325 MG PO TABS
650.0000 mg | ORAL_TABLET | Freq: Once | ORAL | Status: AC
  Administered 2020-05-19: 650 mg via ORAL
  Filled 2020-05-19: qty 2

## 2020-05-19 MED ORDER — ONDANSETRON 4 MG PO TBDP: 4.0000 mg | ORAL_TABLET | Freq: Three times a day (TID) | ORAL | 0 refills | Status: AC | PRN

## 2020-05-19 MED ORDER — SODIUM CHLORIDE 0.9 % IV BOLUS
500.0000 mL | Freq: Once | INTRAVENOUS | Status: AC
  Administered 2020-05-19: 500 mL via INTRAVENOUS

## 2020-05-19 MED ORDER — SODIUM CHLORIDE 0.9 % IV BOLUS
1000.0000 mL | Freq: Once | INTRAVENOUS | Status: AC
  Administered 2020-05-19: 1000 mL via INTRAVENOUS

## 2020-05-19 MED ORDER — FAMOTIDINE 20 MG PO TABS: 20.0000 mg | ORAL_TABLET | Freq: Two times a day (BID) | ORAL | 0 refills | Status: AC

## 2020-05-19 MED ORDER — FENTANYL CITRATE (PF) 100 MCG/2ML IJ SOLN
50.0000 ug | Freq: Once | INTRAMUSCULAR | Status: AC
  Administered 2020-05-19: 50 ug via INTRAVENOUS
  Filled 2020-05-19: qty 2

## 2020-05-19 NOTE — ED Triage Notes (Addendum)
Pt presents to ED POV. Pt c/o emesis x8, began today. abd pain in LUQ and LLQ. Pt reports that she now feels dehydrated, weak and fatigued. Pt reports that she cannot keep water down. LMP - 03/19/20

## 2020-05-19 NOTE — Discharge Instructions (Signed)
Please read and follow all provided instructions.  Your diagnoses today include:  1. Non-intractable vomiting with nausea, unspecified vomiting type   2. LUQ abdominal pain     Tests performed today include:  Blood cell counts and platelets - slightly high white blood cell count  Kidney and liver function tests  Pancreas function test (called lipase)  Urine test to look for infection  A blood or urine test for pregnancy (women only)  Vital signs. See below for your results today.   Medications prescribed:   Zofran (ondansetron) - for nausea and vomiting   Pepcid (famotidine) - antihistamine  You can find this medication over-the-counter.   DO NOT exceed:   20mg  Pepcid every 12 hours  Take any prescribed medications only as directed.  Home care instructions:   Follow any educational materials contained in this packet.  Follow-up instructions: Please follow-up with your primary care provider in the next 3 days for further evaluation of your symptoms.    Return instructions:  SEEK IMMEDIATE MEDICAL ATTENTION IF:  The pain does not go away or becomes severe   A temperature above 101F develops   Repeated vomiting occurs (multiple episodes)   The pain becomes localized to portions of the abdomen. The right side could possibly be appendicitis. In an adult, the left lower portion of the abdomen could be colitis or diverticulitis.   Blood is being passed in stools or vomit (bright red or black tarry stools)   You develop chest pain, difficulty breathing, dizziness or fainting, or become confused, poorly responsive, or inconsolable (young children)  If you have any other emergent concerns regarding your health  Additional Information: Abdominal (belly) pain can be caused by many things. Your caregiver performed an examination and possibly ordered blood/urine tests and imaging (CT scan, x-rays, ultrasound). Many cases can be observed and treated at home after initial  evaluation in the emergency department. Even though you are being discharged home, abdominal pain can be unpredictable. Therefore, you need a repeated exam if your pain does not resolve, returns, or worsens. Most patients with abdominal pain don't have to be admitted to the hospital or have surgery, but serious problems like appendicitis and gallbladder attacks can start out as nonspecific pain. Many abdominal conditions cannot be diagnosed in one visit, so follow-up evaluations are very important.  Your vital signs today were: BP (!) 146/103   Pulse 96   Temp 98.7 F (37.1 C) (Oral)   Resp 19   SpO2 98%  If your blood pressure (bp) was elevated above 135/85 this visit, please have this repeated by your doctor within one month. --------------

## 2020-05-19 NOTE — ED Notes (Signed)
Pt provided tea.

## 2020-05-19 NOTE — ED Provider Notes (Signed)
[facility]   CSN: [id number] Arrival date & time: 05/19/20  1512     History Chief Complaint  Patient presents with  . Emesis    Donna Alvarado is a 35 y.o. female.  Patient with history of cesarean section, no other abdominal surgeries presents emergency department today for evaluation of nausea and vomiting as well as left-sided abdominal pain.  Patient began feeling poorly yesterday but today developed persistent vomiting and she has not been able to keep down any solids or liquids.  She has pain in the left upper abdomen and left flank.  She denies fevers but has had significant chills.  She states that she feels dehydrated and generally weak.  No urinary symptoms such as dysuria or hematuria but states urine has been dark.  She does report frequent alcohol use since the beginning of the pandemic.  Denies heavy NSAID use.  No chest pain or shortness of breath.  She missed her last menstrual period due in March.  No treatments prior to arrival.        Past Medical History:  Diagnosis Date  . Anxiety   . Chest pain, atypical    per pt ? from [facility]- upper right chest pain  . Depression    had PP depression with meds, had harmful thoughts dueing this time  . Endometriosis   . Headache(784.0)   . Pregnancy induced hypertension   . Shortness of breath     Patient Active Problem List   Diagnosis Date Noted  . Preeclampsia, third trimester 11/14/2015  . Non-reassuring electronic fetal monitoring tracing 11/14/2015  . Postpartum care following cesarean delivery 11/14/2015  . Preeclampsia 05/07/2013  . Mild or unspecified pre-eclampsia, with delivery 05/06/2013  . Anemia 05/06/2013  . Pre-eclampsia, mild, postpartum 05/06/2013  . S/P repeat low transverse C-section 05/02/2013  . 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 Depression 12/14/2012  . Generalized anxiety disorder 12/14/2012    Past Surgical History:  Procedure  Laterality Date  . CESAREAN SECTION N/A 05/01/2013   Procedure: CESAREAN SECTION;  Surgeon: Purcell NailsAngela Y Roberts, MD;  Location: [facility] ORS;  Service: Obstetrics;  Laterality: N/A;  . CESAREAN SECTION N/A 11/14/2015   Procedure: CESAREAN SECTION;  Surgeon: Edwinna Areolaecilia Worema Banga, DO;  Location: [facility];  Service: Obstetrics;  Laterality: N/A;  . LAPAROSCOPY  11/03/2010   Procedure: LAPAROSCOPY DIAGNOSTIC;  Surgeon: Loney LaurenceMichelle A Horvath;  Location: [facility] OR[facility];  Service: Gynecology;;  Chromopertubation     OB History    Gravida  3   Para  2   Term  2   Preterm  0   AB  1   Living  2     SAB  1   IAB  0   Ectopic  0   Multiple  0   Live Births  2           Family History  Problem Relation Age of Onset  . Alcohol abuse Neg Hx   . Arthritis Neg Hx   . Asthma Neg Hx   . Cancer Neg Hx   . Birth defects Neg Hx   . COPD Neg Hx   . Depression Neg Hx   . Diabetes Neg Hx   . Drug abuse Neg Hx   . Early death Neg Hx   . Hearing loss Neg Hx   . Heart disease Neg Hx   . Hyperlipidemia Neg Hx   . Hypertension Neg Hx   . Kidney  disease Neg Hx   . Learning disabilities Neg Hx   . Mental illness Neg Hx   . Mental retardation Neg Hx   . Miscarriages / Stillbirths Neg Hx   . Stroke Neg Hx   . Vision loss Neg Hx   . Varicose Veins Neg Hx     Social History   Tobacco Use  . Smoking status: Former Smoker    Types: Cigarettes    Quit date: 06/25/2012    Years since quitting: 7.9  . Smokeless tobacco: Never Used  Substance Use Topics  . Alcohol use: No    Comment: occasional  . Drug use: No    Home Medications Prior to Admission medications   Medication Sig Start Date End Date Taking? Authorizing Provider  acetaminophen (TYLENOL) 500 MG tablet Take 500 mg by mouth every 4 (four) hours as needed for mild pain, moderate pain or headache.    [provider]  amLODipine (NORVASC) 5 MG tablet Take 1 tablet (5 mg total) by mouth daily. 11/04/18   Dahlia Byes A, NP   labetalol (NORMODYNE) 300 MG tablet Take 1 tablet (300 mg total) by mouth every 8 (eight) hours. 11/18/15 11/04/18  Bovard-Stuckert, Augusto Gamble, MD  NIFEdipine (PROCARDIA XL/ADALAT-CC) 90 MG 24 hr tablet Take 1 tablet (90 mg total) by mouth daily. 11/19/15 11/04/18  Bovard-Stuckert, Augusto Gamble, MD  sertraline (ZOLOFT) 50 MG tablet Take 1 tablet (50 mg total) by mouth 2 (two) times daily. 11/18/15 11/04/18  Bovard-Stuckert, Augusto Gamble, MD    Allergies    Sulfa antibiotics  Review of Systems   Review of Systems  Constitutional: Positive for chills. Negative for fever.  HENT: Negative for rhinorrhea and sore throat.   Eyes: Negative for redness.  Respiratory: Negative for cough.   Cardiovascular: Negative for chest pain.  Gastrointestinal: Positive for abdominal pain, nausea and vomiting. Negative for diarrhea.  Genitourinary: Negative for dysuria, frequency, hematuria, urgency, vaginal bleeding and vaginal discharge.  Musculoskeletal: Negative for myalgias.  Skin: Negative for rash.  Neurological: Negative for headaches.    Physical Exam Updated Vital Signs BP (!) 152/110 (BP Location: Right Arm)   Pulse (!) 124   Temp 98.7 F (37.1 C) (Oral)   Resp 15   SpO2 98%   Physical Exam Vitals and nursing note reviewed.  Constitutional:      General: She is not in acute distress.    Appearance: She is well-developed.  HENT:     Head: Normocephalic and atraumatic.     Right Ear: External ear normal.     Left Ear: External ear normal.     Nose: Nose normal.  Eyes:     Conjunctiva/sclera: Conjunctivae normal.  Cardiovascular:     Rate and Rhythm: Normal rate and regular rhythm.     Heart sounds: No murmur heard.   Pulmonary:     Effort: No respiratory distress.     Breath sounds: No wheezing, rhonchi or rales.  Abdominal:     Palpations: Abdomen is soft.     Tenderness: There is abdominal tenderness. There is no guarding or rebound.     Comments: Mild LUQ and LLQ tenderness   Musculoskeletal:     Cervical back: Normal range of motion and neck supple.     Right lower leg: No edema.     Left lower leg: No edema.  Skin:    General: Skin is warm and dry.     Findings: No rash.  Neurological:     General: No focal deficit  present.     Mental Status: She is alert. Mental status is at baseline.     Motor: No weakness.  Psychiatric:        Mood and Affect: Mood normal.     ED Results / Procedures / Treatments   Labs (all labs ordered are listed, but only abnormal results are displayed) Labs Reviewed  CBC WITH DIFFERENTIAL/PLATELET - Abnormal; Notable for the following components:      Result Value   WBC 11.9 (*)    MCH 25.0 (*)    RDW 22.9 (*)    Neutro Abs 10.2 (*)    All other components within normal limits  COMPREHENSIVE METABOLIC PANEL - Abnormal; Notable for the following components:   Sodium 132 (*)    Chloride 93 (*)    CO2 20 (*)    Glucose, Bld 120 (*)    Total Protein 9.1 (*)    AST 89 (*)    Total Bilirubin 1.6 (*)    Anion gap 19 (*)    All other components within normal limits  URINALYSIS, ROUTINE W REFLEX MICROSCOPIC - Abnormal; Notable for the following components:   Color, Urine AMBER (*)    APPearance CLOUDY (*)    Glucose, UA 50 (*)    Ketones, ur 20 (*)    Protein, ur 100 (*)    Bacteria, UA RARE (*)    All other components within normal limits  LACTIC ACID, PLASMA - Abnormal; Notable for the following components:   Lactic Acid, Venous 3.2 (*)    All other components within normal limits  LIPASE, BLOOD  HCG, QUANTITATIVE, PREGNANCY  LACTIC ACID, PLASMA    EKG None  Radiology CT ABDOMEN PELVIS W CONTRAST  Result Date: 05/19/2020 CLINICAL DATA:  Left upper quadrant pain with nausea and vomiting EXAM: CT ABDOMEN AND PELVIS WITH CONTRAST TECHNIQUE: Multidetector CT imaging of the abdomen and pelvis was performed using the standard protocol following bolus administration of intravenous contrast. CONTRAST:   OMNIPAQUE IOHEXOL 300 MG/ML  SOLN COMPARISON:  May 10, 2012 FINDINGS: Lower chest: No acute abnormality. Hepatobiliary: Hepatic steatosis. No suspicious hepatic lesion. Gallbladder is mildly distended with out wall thickening or pericholecystic fluid, commonly seen in a fasting state. No biliary ductal dilation. Pancreas: Unremarkable. No pancreatic ductal dilatation or surrounding inflammatory changes. Spleen: Normal in size without focal abnormality. Adrenals/Urinary Tract: Adrenal glands are unremarkable. Kidneys are normal, without renal calculi, focal lesion, or hydronephrosis. Bladder is unremarkable. Stomach/Bowel: Stomach is grossly unremarkable for degree of distension. There are some prominent loops of small bowel in the left upper quadrant which demonstrate sub mucosal hyperenhancement. No evidence of small-bowel obstruction. The appendix and terminal ileum are grossly unremarkable. The transverse descending and sigmoid colon are decompressed. No colonic diverticular disease identified. Vascular/Lymphatic: No significant vascular findings are present. No enlarged abdominal or pelvic lymph nodes. Reproductive: 4.5 cm left adnexal cyst. The uterus and right adnexa are unremarkable. Other: No abdominal wall hernia or abnormality. No abdominopelvic ascites. Musculoskeletal: No acute or significant osseous findings. IMPRESSION: 1. There are some prominent loops of small bowel in the left upper quadrant which demonstrate sub mucosal hyperenhancement. Findings may represent enteritis. 2. Hepatic steatosis. 3. 4.5 cm left adnexal cyst. No follow-up imaging recommended. Note: This recommendation does not apply to premenarchal patients and to those with increased risk (genetic, family history, elevated tumor markers or other high-risk factors) of ovarian cancer. Reference: JACR 2020 Feb; 17(2):248-254 Electronically Signed   By: Maudry Mayhew MD  On: 05/19/2020 20:19    Procedures Procedures   Medications  Ordered in ED Medications  sodium chloride 0.9 % bolus 1,000 mL (has no administration in time range)  acetaminophen (TYLENOL) tablet 650 mg (has no administration in time range)  ondansetron (ZOFRAN-ODT) disintegrating tablet 4 mg (4 mg Oral Given 05/19/20 1540)    ED Course  I have reviewed the triage vital signs and the nursing notes.  Pertinent labs & imaging results that were available during my care of the patient were reviewed by me and considered in my medical decision making (see chart for details).  Patient seen and examined. Work-up initiated. Medications ordered.   Vital signs reviewed and are as follows: BP (!) 142/106 (BP Location: Right Arm)   Pulse 99   Temp 98.7 F (37.1 C) (Oral)   Resp 16   SpO2 100%   6:28 PM patient rechecked.  She is starting to feel better with IV fluids.  Initial work-up demonstrates mildly elevated WBC count.  She is not pregnant.  Tachycardia improving, heart rate now 90s.  Lactate is elevated at 3.2.  Will give additional IV fluids, pain control.  Discussed CT imaging with patient and she agrees to proceed.  10:38 PM  patient improved with treatment over the past several hours and she is stating that she feels much better.  CT performed and shows some signs of enteritis, adnexal cyst, otherwise negative.  Pulse rate improved into the 90s.  Lactate normalized with IV fluids.  Patient is tolerating p.o.'s without vomiting.  Plan for discharged home with prescription for Zofran and Pepcid.  Discussed clear liquid diet, advancing to brat diet and then regular diet over the next 1 to 2 days.  The patient was urged to return to the Emergency Department immediately with worsening of current symptoms, worsening abdominal pain, persistent vomiting, blood noted in stools, fever, or any other concerns. The patient verbalized understanding.     MDM Rules/Calculators/A&P                          Patient with abdominal pain, N/V. Vitals are stable, no  fever. Labs mildly elevated white blood cell count and elevated lactate, suspect due to dehydration. Imaging reassuring. No signs of dehydration, patient is tolerating PO's after treatment. Lungs are clear and no signs suggestive of PNA. Low concern for appendicitis, cholecystitis, pancreatitis, ruptured viscus, UTI, kidney stone, aortic dissection, aortic aneurysm or other emergent abdominal etiology. Supportive therapy indicated with return if symptoms worsen.   Final Clinical Impression(s) / ED Diagnoses Final diagnoses:  Non-intractable vomiting with nausea, unspecified vomiting type  LUQ abdominal pain    Rx / DC Orders ED Discharge Orders         Ordered    famotidine (PEPCID) 20 MG tablet  2 times daily        05/19/20 2235    ondansetron (ZOFRAN ODT) 4 MG disintegrating tablet  Every 8 hours PRN        05/19/20 2235           Renne Crigler, PA-C 05/19/20 2239    Terald Sleeper, MD 05/20/20 1041

## 2020-05-19 NOTE — ED Notes (Signed)
Patient transported to CT 

## 2020-05-19 NOTE — ED Triage Notes (Signed)
Emergency Medicine Provider Triage Evaluation Note  Donna Alvarado , a 35 y.o. female  was evaluated in triage.  Pt complains of nausea vomiting.  Review of Systems  Positive: LLQ abd pain, nausea, vomiting, chills Negative: Fever, cp, sob, dysuria, hematuria, vaginal bleeding  Physical Exam  BP (!) 152/110 (BP Location: Right Arm)   Pulse (!) 124   Temp 98.7 F (37.1 C) (Oral)   Resp 15   SpO2 98%  Gen:   Awake, no distress, rigors HEENT:  Atraumatic  Resp:  Normal effort  Cardiac:  Normal rate  Abd:   Nondistended, nontender  MSK:   Moves extremities without difficulty  Neuro:  Speech clear   Medical Decision Making  Medically screening exam initiated at 3:38 PM.  Appropriate orders placed.  Adrian Specht was informed that the remainder of the evaluation will be completed by another provider, this initial triage assessment does not replace that evaluation, and the importance of remaining in the ED until their evaluation is complete.  Clinical Impression  abd pain with nausea and vomiting, feeling dehydrated x 3 days.  Late on her regular menstruation.  COVID vaccinated.    Fayrene Helper, PA-C 05/19/20 1540

## 2020-05-19 NOTE — ED Notes (Signed)
Pt back from CT

## 2021-07-21 ENCOUNTER — Emergency Department
Admission: EM | Admit: 2021-07-21 | Discharge: 2021-07-21 | Discharge status: Against Medical Advice | Payer: No Typology Code available for payment source | Attending: Emergency Medicine | Admitting: Emergency Medicine

## 2021-07-21 ENCOUNTER — Other Ambulatory Visit: Payer: Self-pay

## 2021-07-21 DIAGNOSIS — T7411XA Adult physical abuse, confirmed, initial encounter: Secondary | ICD-10-CM | POA: Insufficient documentation

## 2021-07-21 DIAGNOSIS — Y908 Blood alcohol level of 240 mg/100 ml or more: Secondary | ICD-10-CM | POA: Insufficient documentation

## 2021-07-21 DIAGNOSIS — Z5321 Procedure and treatment not carried out due to patient leaving prior to being seen by health care provider: Secondary | ICD-10-CM | POA: Insufficient documentation

## 2021-07-21 DIAGNOSIS — F1010 Alcohol abuse, uncomplicated: Secondary | ICD-10-CM | POA: Insufficient documentation

## 2021-07-21 LAB — URINE DRUG SCREEN, QUALITATIVE (ARMC ONLY)
Amphetamines, Ur Screen: NOT DETECTED
Barbiturates, Ur Screen: NOT DETECTED
Benzodiazepine, Ur Scrn: NOT DETECTED
Cannabinoid 50 Ng, Ur ~~LOC~~: NOT DETECTED
Cocaine Metabolite,Ur ~~LOC~~: NOT DETECTED
MDMA (Ecstasy)Ur Screen: NOT DETECTED
Methadone Scn, Ur: NOT DETECTED
Opiate, Ur Screen: NOT DETECTED
Phencyclidine (PCP) Ur S: NOT DETECTED
Tricyclic, Ur Screen: NOT DETECTED

## 2021-07-21 LAB — COMPREHENSIVE METABOLIC PANEL
ALT: 26 U/L (ref 0–44)
AST: 62 U/L — ABNORMAL HIGH (ref 15–41)
Albumin: 4.3 g/dL (ref 3.5–5.0)
Alkaline Phosphatase: 81 U/L (ref 38–126)
Anion gap: 23 — ABNORMAL HIGH (ref 5–15)
BUN: 7 mg/dL (ref 6–20)
CO2: 15 mmol/L — ABNORMAL LOW (ref 22–32)
Calcium: 8.7 mg/dL — ABNORMAL LOW (ref 8.9–10.3)
Chloride: 100 mmol/L (ref 98–111)
Creatinine, Ser: 0.74 mg/dL (ref 0.44–1.00)
GFR, Estimated: >60 mL/min (ref >60)
Glucose, Bld: 82 mg/dL (ref 70–99)
Potassium: 2.9 mmol/L — ABNORMAL LOW (ref 3.5–5.1)
Sodium: 138 mmol/L (ref 135–145)
Total Bilirubin: 1.1 mg/dL (ref 0.3–1.2)
Total Protein: 10.1 g/dL — ABNORMAL HIGH (ref 6.5–8.1)

## 2021-07-21 LAB — CBC WITH DIFFERENTIAL/PLATELET
Abs Immature Granulocytes: 0.02 10*3/uL (ref 0.00–0.07)
Basophils Absolute: 0 10*3/uL (ref 0.0–0.1)
Basophils Relative: 0 %
Eosinophils Absolute: 0 10*3/uL (ref 0.0–0.5)
Eosinophils Relative: 0 %
HCT: 40.3 % (ref 36.0–46.0)
Hemoglobin: 12.3 g/dL (ref 12.0–15.0)
Immature Granulocytes: 0 %
Lymphocytes Relative: 57 %
Lymphs Abs: 2.8 10*3/uL (ref 0.7–4.0)
MCH: 27.3 pg (ref 26.0–34.0)
MCHC: 30.5 g/dL (ref 30.0–36.0)
MCV: 89.4 fL (ref 80.0–100.0)
Monocytes Absolute: 0.3 10*3/uL (ref 0.1–1.0)
Monocytes Relative: 5 %
Neutro Abs: 1.9 10*3/uL (ref 1.7–7.7)
Neutrophils Relative %: 38 %
Platelets: 193 10*3/uL (ref 150–400)
RBC: 4.51 MIL/uL (ref 3.87–5.11)
RDW: 17.8 % — ABNORMAL HIGH (ref 11.5–15.5)
WBC: 4.9 10*3/uL (ref 4.0–10.5)
nRBC: 0 % (ref 0.0–0.2)

## 2021-07-21 LAB — POC URINE PREG, ED: Preg Test, Ur: NEGATIVE

## 2021-07-21 LAB — ETHANOL: Alcohol, Ethyl (B): 383 mg/dL (ref <10)
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
## 2021-07-21 NOTE — ED Notes (Signed)
Triage nurse Helene Shoe RN reports pt st she was leaving and walked out of the ED lobby and thru parking lot; charge nurse aware
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
## 2022-03-30 ENCOUNTER — Ambulatory Visit: Payer: Self-pay | Admitting: Family Medicine

## 2022-04-15 DIAGNOSIS — Z113 Encounter for screening for infections with a predominantly sexual mode of transmission: Secondary | ICD-10-CM | POA: Diagnosis not present

## 2022-04-15 DIAGNOSIS — Z131 Encounter for screening for diabetes mellitus: Secondary | ICD-10-CM | POA: Diagnosis not present

## 2022-04-15 DIAGNOSIS — Z0000 Encounter for general adult medical examination without abnormal findings: Secondary | ICD-10-CM | POA: Diagnosis not present

## 2022-04-15 DIAGNOSIS — Z13220 Encounter for screening for lipoid disorders: Secondary | ICD-10-CM | POA: Diagnosis not present

## 2022-05-05 DIAGNOSIS — Z124 Encounter for screening for malignant neoplasm of cervix: Secondary | ICD-10-CM | POA: Diagnosis not present

## 2022-05-18 DIAGNOSIS — D509 Iron deficiency anemia, unspecified: Secondary | ICD-10-CM | POA: Diagnosis not present

## 2022-05-18 DIAGNOSIS — N924 Excessive bleeding in the premenopausal period: Secondary | ICD-10-CM | POA: Diagnosis not present

## 2022-05-18 DIAGNOSIS — D508 Other iron deficiency anemias: Secondary | ICD-10-CM | POA: Diagnosis not present

## 2022-06-16 DIAGNOSIS — F331 Major depressive disorder, recurrent, moderate: Secondary | ICD-10-CM | POA: Diagnosis not present

## 2022-06-16 DIAGNOSIS — F411 Generalized anxiety disorder: Secondary | ICD-10-CM | POA: Diagnosis not present

## 2022-06-16 DIAGNOSIS — I10 Essential (primary) hypertension: Secondary | ICD-10-CM | POA: Diagnosis not present

## 2022-06-16 DIAGNOSIS — D508 Other iron deficiency anemias: Secondary | ICD-10-CM | POA: Diagnosis not present

## 2022-06-23 IMAGING — CT CT ABD-PELV W/ CM
2 of 4 series · 16 of 46 positions shown, 18 images · IV contrast (omnipaque)
Comparison: May 10, 2012

CLINICAL DATA: Left upper quadrant pain with nausea and vomiting

EXAM:
CT ABDOMEN AND PELVIS WITH CONTRAST
TECHNIQUE: Multidetector CT imaging of the abdomen and pelvis was performed
using the standard protocol following bolus administration of
intravenous contrast.
CONTRAST:  100mL OMNIPAQUE IOHEXOL 300 MG/ML  SOLN

[Series 3: abdomen 5.0 · axial · 0.70mm/px · z∈[-853,-493]mm · 13 of 82 slices shown, 15 images]
[im 5/82  soft-tissue]
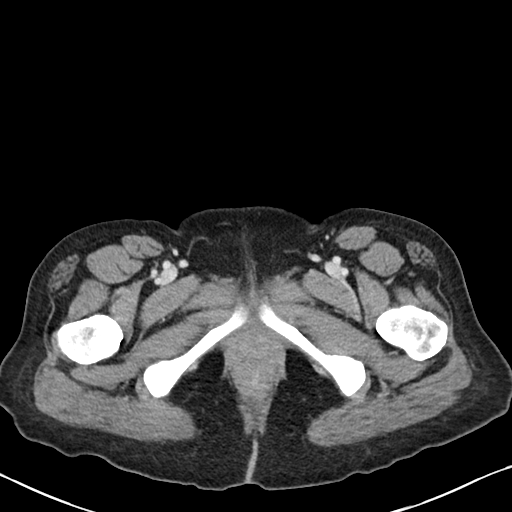
[im 5/82  bone]
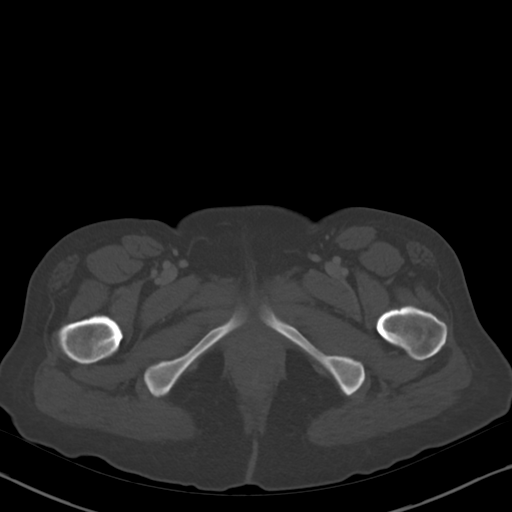
[im 10/82  soft-tissue]
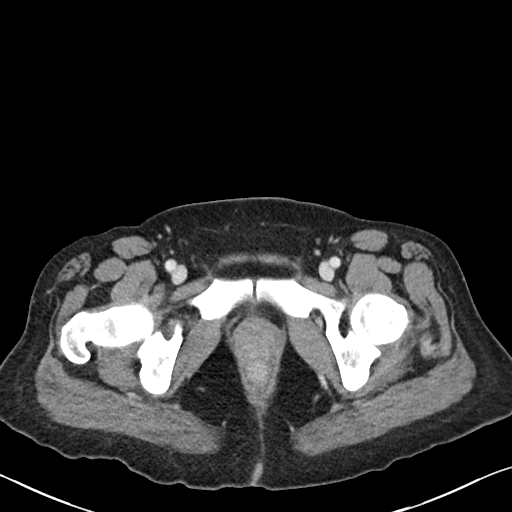
[im 20/82  soft-tissue]
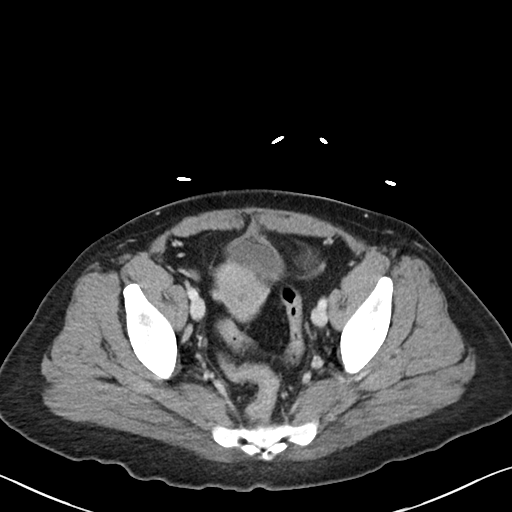
[im 24/82  soft-tissue]
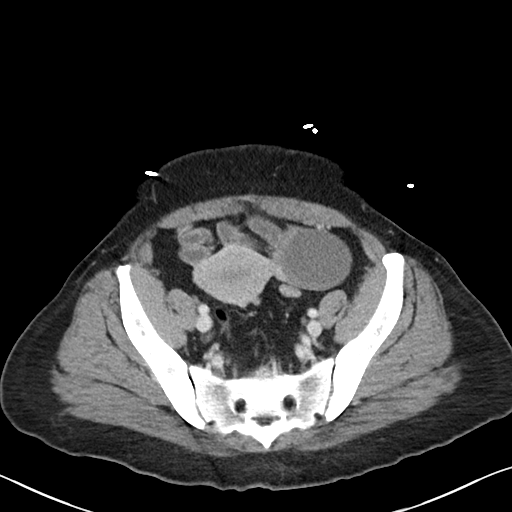
[im 29/82  soft-tissue]
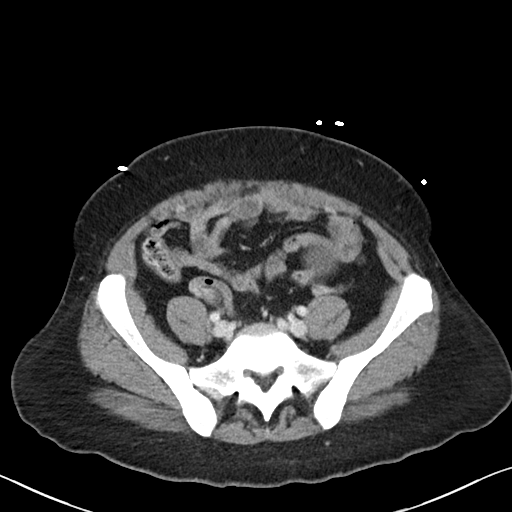
[im 34/82  soft-tissue]
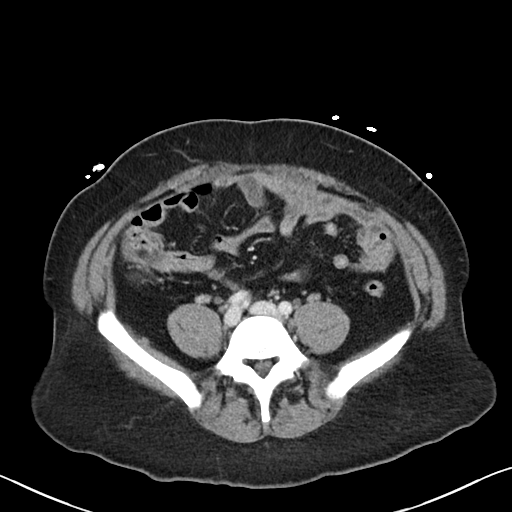
[im 43/82  soft-tissue]
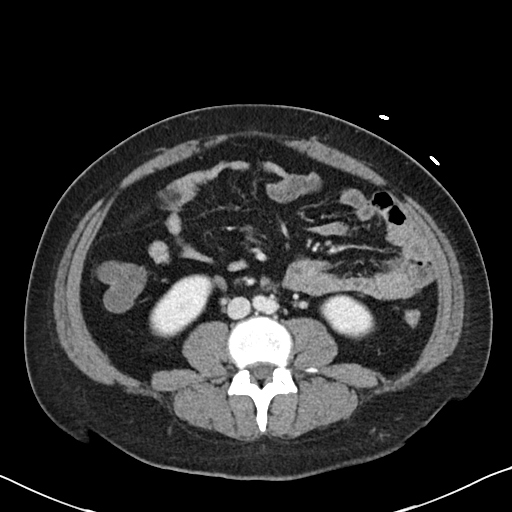
[im 48/82  soft-tissue]
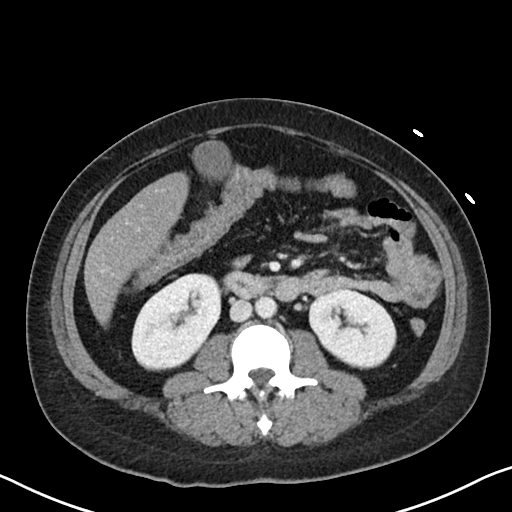
[im 53/82  soft-tissue]
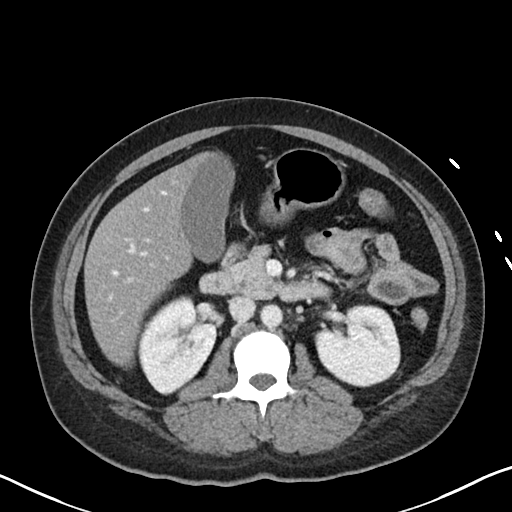
[im 53/82  bone]
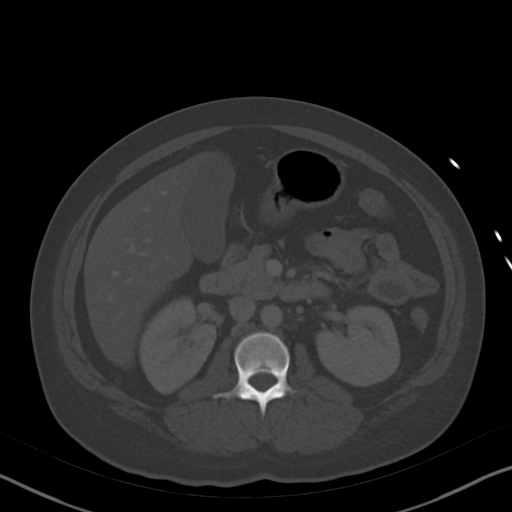
[im 58/82  soft-tissue]
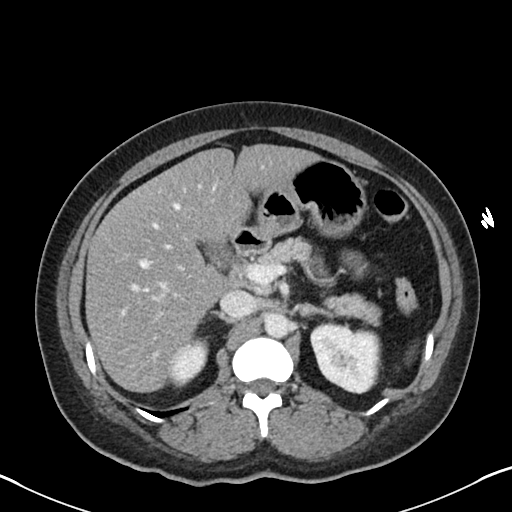
[im 62/82  soft-tissue]
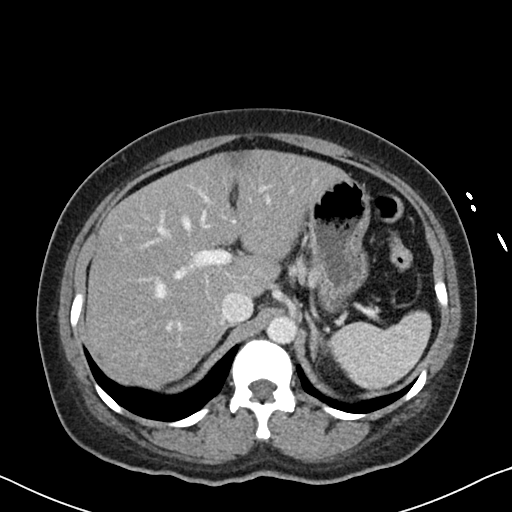
[im 72/82  soft-tissue]
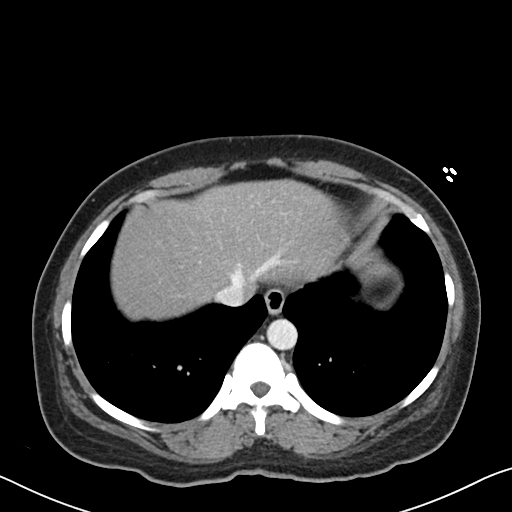
[im 77/82  soft-tissue]
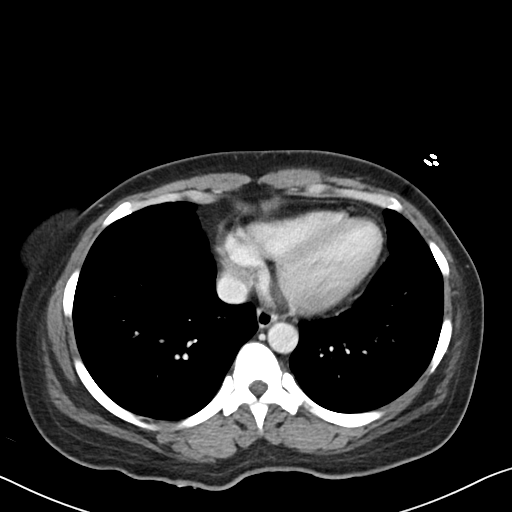

[Series 6: abdomen 3.0 mpr cor · coronal · 0.78mm/px · 3 of 94 slices shown]
[im 32/94  soft-tissue]
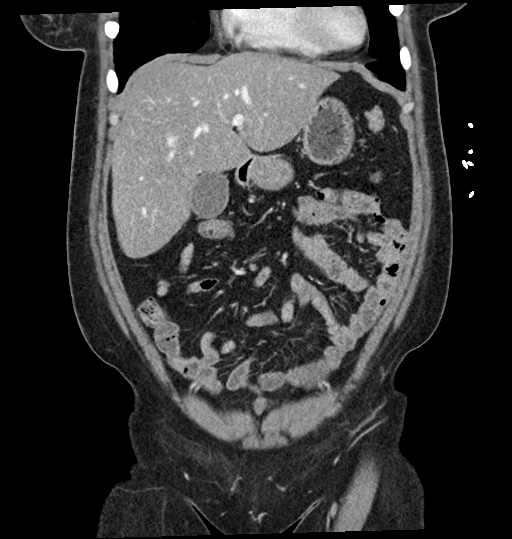
[im 42/94  soft-tissue]
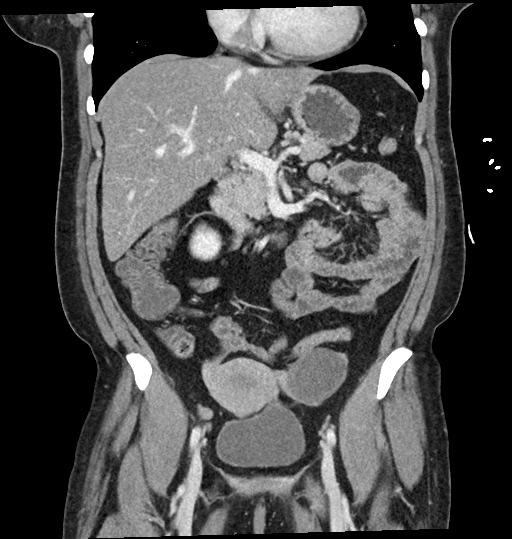
[im 52/94  soft-tissue]
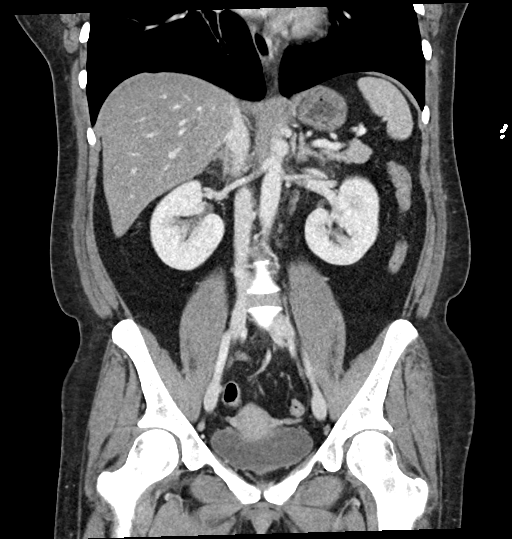

[16 of 46 positions shown; findings below may reference images not displayed]

FINDINGS: Lower chest: No acute abnormality.

Hepatobiliary: Hepatic steatosis. No suspicious hepatic lesion.
Gallbladder is mildly distended with out wall thickening or
pericholecystic fluid, commonly seen in a fasting state. No biliary
ductal dilation.

Pancreas: Unremarkable. No pancreatic ductal dilatation or
surrounding inflammatory changes.

Spleen: Normal in size without focal abnormality.

Adrenals/Urinary Tract: Adrenal glands are unremarkable. Kidneys are
normal, without renal calculi, focal lesion, or hydronephrosis.
Bladder is unremarkable.

Stomach/Bowel: Stomach is grossly unremarkable for degree of
distension. There are some prominent loops of small bowel in the
left upper quadrant which demonstrate sub mucosal hyperenhancement.
No evidence of small-bowel obstruction. The appendix and terminal
ileum are grossly unremarkable. The transverse descending and
sigmoid colon are decompressed. No colonic diverticular disease
identified.

Vascular/Lymphatic: No significant vascular findings are present. No
enlarged abdominal or pelvic lymph nodes.

Reproductive: 4.5 cm left adnexal cyst. The uterus and right adnexa
are unremarkable.

Other: No abdominal wall hernia or abnormality. No abdominopelvic
ascites.

Musculoskeletal: No acute or significant osseous findings.
IMPRESSION: 1. There are some prominent loops of small bowel in the left upper
quadrant which demonstrate sub mucosal hyperenhancement. Findings
may represent enteritis.
2. Hepatic steatosis.
3. 4.5 cm left adnexal cyst. No follow-up imaging recommended. Note:
This recommendation does not apply to premenarchal patients and to
those with increased risk (genetic, family history, elevated tumor
markers or other high-risk factors) of ovarian cancer. Reference:
JACR [DATE]):[id number]
